# Patient Record
Sex: Female | Born: 1960 | Race: White | Hispanic: No | Marital: Married | Smoking: Never smoker
Health system: Southern US, Community
[De-identification: ages and names within clinical notes are randomized; demographics above are authoritative.]

## PROBLEM LIST (undated history)

## (undated) DIAGNOSIS — K219 Gastro-esophageal reflux disease without esophagitis: Secondary | ICD-10-CM

## (undated) DIAGNOSIS — A499 Bacterial infection, unspecified: Secondary | ICD-10-CM

## (undated) DIAGNOSIS — E079 Disorder of thyroid, unspecified: Secondary | ICD-10-CM

## (undated) DIAGNOSIS — E785 Hyperlipidemia, unspecified: Secondary | ICD-10-CM

## (undated) DIAGNOSIS — N390 Urinary tract infection, site not specified: Secondary | ICD-10-CM

## (undated) HISTORY — DX: Hyperlipidemia, unspecified: E78.5

## (undated) HISTORY — DX: Gastro-esophageal reflux disease without esophagitis: K21.9

## (undated) HISTORY — PX: REFRACTIVE SURGERY: SHX103

## (undated) HISTORY — PX: OTHER SURGICAL HISTORY: SHX169

## (undated) HISTORY — DX: Disorder of thyroid, unspecified: E07.9

## (undated) HISTORY — DX: Urinary tract infection, site not specified: N39.0

## (undated) HISTORY — PX: LASIK: SHX215

## (undated) HISTORY — DX: Urinary tract infection, site not specified: A49.9

---

## 1998-07-25 ENCOUNTER — Other Ambulatory Visit: Admission: RE | Admit: 1998-07-25 | Discharge: 1998-07-25 | Payer: Self-pay | Admitting: Obstetrics and Gynecology

## 2013-11-25 ENCOUNTER — Ambulatory Visit (INDEPENDENT_AMBULATORY_CARE_PROVIDER_SITE_OTHER): Payer: BC Managed Care – PPO | Admitting: Nurse Practitioner

## 2013-11-25 ENCOUNTER — Other Ambulatory Visit (HOSPITAL_COMMUNITY)
Admission: RE | Admit: 2013-11-25 | Discharge: 2013-11-25 | Disposition: A | Discharge status: Home / Self Care | Payer: BC Managed Care – PPO | Source: Ambulatory Visit | Attending: Nurse Practitioner | Admitting: Nurse Practitioner

## 2013-11-25 ENCOUNTER — Encounter: Payer: Self-pay | Admitting: Nurse Practitioner

## 2013-11-25 ENCOUNTER — Other Ambulatory Visit: Payer: Self-pay | Admitting: Nurse Practitioner

## 2013-11-25 VITALS — BP 127/78 | HR 79 | Temp 98.2°F | Ht 62.16 in | Wt 178.0 lb

## 2013-11-25 DIAGNOSIS — Z1151 Encounter for screening for human papillomavirus (HPV): Secondary | ICD-10-CM | POA: Insufficient documentation

## 2013-11-25 DIAGNOSIS — Z0000 Encounter for general adult medical examination without abnormal findings: Secondary | ICD-10-CM

## 2013-11-25 DIAGNOSIS — Z1239 Encounter for other screening for malignant neoplasm of breast: Secondary | ICD-10-CM

## 2013-11-25 DIAGNOSIS — Z01419 Encounter for gynecological examination (general) (routine) without abnormal findings: Secondary | ICD-10-CM | POA: Insufficient documentation

## 2013-11-25 DIAGNOSIS — Z124 Encounter for screening for malignant neoplasm of cervix: Secondary | ICD-10-CM | POA: Insufficient documentation

## 2013-11-25 LAB — COMPREHENSIVE METABOLIC PANEL
ALT: 20 U/L (ref 0–35)
AST: 16 U/L (ref 0–37)
Albumin: 4.4 g/dL (ref 3.5–5.2)
Alkaline Phosphatase: 53 U/L (ref 39–117)
BUN: 9 mg/dL (ref 6–23)
CALCIUM: 10 mg/dL (ref 8.4–10.5)
CHLORIDE: 101 meq/L (ref 96–112)
CO2: 29 meq/L (ref 19–32)
Creatinine, Ser: 0.8 mg/dL (ref 0.4–1.2)
GFR: 81.54 mL/min (ref >60.00)
Glucose, Bld: 87 mg/dL (ref 70–99)
Potassium: 3.7 mEq/L (ref 3.5–5.1)
SODIUM: 138 meq/L (ref 135–145)
Total Bilirubin: 0.7 mg/dL (ref 0.3–1.2)
Total Protein: 7.3 g/dL (ref 6.0–8.3)

## 2013-11-25 LAB — URINALYSIS, ROUTINE W REFLEX MICROSCOPIC
Bilirubin Urine: NEGATIVE
Hgb urine dipstick: NEGATIVE
Ketones, ur: 15 — AB
Leukocytes, UA: NEGATIVE
NITRITE: NEGATIVE
PH: 6 (ref 5.0–8.0)
RBC / HPF: NONE SEEN (ref 0–?)
SPECIFIC GRAVITY, URINE: 1.015 (ref 1.000–1.030)
Total Protein, Urine: NEGATIVE
Urine Glucose: NEGATIVE
Urobilinogen, UA: 0.2 (ref 0.0–1.0)

## 2013-11-25 LAB — LIPID PANEL
Cholesterol: 221 mg/dL — ABNORMAL HIGH (ref 0–200)
HDL: 61.3 mg/dL (ref >39.00)
LDL Cholesterol: 146 mg/dL — ABNORMAL HIGH (ref 0–99)
Total CHOL/HDL Ratio: 4
Triglycerides: 70 mg/dL (ref 0.0–149.0)
VLDL: 14 mg/dL (ref 0.0–40.0)

## 2013-11-25 LAB — CBC
HEMATOCRIT: 35.2 % — AB (ref 36.0–46.0)
Hemoglobin: 11.5 g/dL — ABNORMAL LOW (ref 12.0–15.0)
MCHC: 32.8 g/dL (ref 30.0–36.0)
MCV: 83.4 fl (ref 78.0–100.0)
Platelets: 265 10*3/uL (ref 150.0–400.0)
RBC: 4.22 Mil/uL (ref 3.87–5.11)
RDW: 14.7 % — ABNORMAL HIGH (ref 11.5–14.6)
WBC: 7.3 10*3/uL (ref 4.5–10.5)

## 2013-11-25 LAB — TSH: TSH: 4.61 u[IU]/mL (ref 0.35–5.50)

## 2013-11-25 LAB — HEMOGLOBIN A1C: HEMOGLOBIN A1C: 6.2 % (ref 4.6–6.5)

## 2013-11-25 NOTE — Progress Notes (Signed)
Pre visit review using our clinic review tool, if applicable. No additional management support is needed unless otherwise documented below in the visit note. 

## 2013-11-25 NOTE — Progress Notes (Signed)
Subjective:     Jill Berry is a 53 y.o. female and is here to establish care. Historical PC at [facility]. The patient reports no problems.  History   Social History  . Marital Status: Married    Spouse Name: N/A    Number of Children: 2  . Years of Education: HS   Occupational History  .      authorizations for biologic meds   Social History Main Topics  . Smoking status: Never Smoker   . Smokeless tobacco: Never Used  . Alcohol Use: Yes     Comment: occasional  . Drug Use: No  . Sexual Activity: Yes   Other Topics Concern  . Not on file   Social History Narrative   Jill Berry lives with her husband in [place]. She has 2 grown children. 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      Her son was treated for medullary thyroid cancer at [facility] with thyrectomy. He has mets (2 areas) to bones which have remained unchanged since 2010.    (symtpoms began in HS with facial flushing and abdominal cramping which progressed palpitations & purple discoloration of face, neck, chest)    He has yearly surveillance w/ MRI & CT scans. He is doing well, about to graduate from law school in [place], [place].   Health Maintenance  Topic Date Due  . Mammogram  11/13/2012  . Influenza Vaccine  04/30/2014 (Originally 04/08/2013)  . Tetanus/tdap  11/29/2014  . Pap Smear  11/25/2016  . Colonoscopy  11/29/2022    The following portions of the patient's history were reviewed and updated as appropriate: allergies, current medications, past family history, past medical history, past social history, past surgical history and problem list.  Review of Systems Constitutional: negative for chills, fatigue, fevers and night sweats Eyes: had lasik surgery Respiratory: negative for cough Cardiovascular: negative for chest pressure/discomfort, fatigue, irregular heart beat and lower extremity edema Gastrointestinal: negative for abdominal pain and change  in bowel habits Genitourinary:negative for abnormal menstrual periods Integument/breast: negative for breast tenderness and rash Musculoskeletal:negative for arthralgias and myalgias Neurological: negative for headaches Behavioral/Psych: negative for anxiety, depression, excessive alcohol consumption, illegal drug usage, sleep disturbance and tobacco use Endocrine: negative for diabetic symptoms including polydipsia, polyphagia and polyuria and temperature intolerance   Objective:    BP 127/78  Pulse 79  Temp(Src) 98.2 F (36.8 C) (Temporal)  Ht 5' 2.16" (1.579 m)  Wt 178 lb (80.74 kg)  BMI 32.38 kg/m2  SpO2 99%  LMP 10/09/2013 General appearance: alert, cooperative, appears stated age and no distress Head: Normocephalic, without obvious abnormality, atraumatic Eyes: negative findings: lids and lashes normal, conjunctivae and sclerae normal, corneas clear and pupils equal, round, reactive to light and accomodation Ears: normal TM's and external ear canals both ears Throat: lips, mucosa, and tongue normal; teeth and gums normal Neck: no adenopathy, no carotid bruit, supple, symmetrical, trachea midline and thyroid not enlarged, symmetric, no tenderness/mass/nodules Lungs: clear to auscultation bilaterally Breasts: normal appearance, no masses or tenderness Heart: regular rate and rhythm, S1, S2 normal, no murmur, click, rub or gallop Abdomen: soft, non-tender; bowel sounds normal; no masses,  no organomegaly Pelvic: adnexae not palpable, cervix normal in appearance, external genitalia normal, no adnexal masses or tenderness, no bladder tenderness, no cervical motion tenderness, perianal skin: no external genital warts noted, urethra without abnormality or discharge and vagina normal without discharge Extremities: extremities normal, atraumatic, no cyanosis or edema Pulses:  2+ and symmetric Skin: Skin color, texture, turgor normal. No rashes or lesions Lymph nodes: Cervical,  supraclavicular, and axillary nodes normal.    Assessment & Plan:    Healthy female exam.  See problem list for complete A&P.    F/u as indicated per lab results.

## 2013-11-25 NOTE — Patient Instructions (Signed)
Our office will call you with lab results. Your exam is normal. Please get mammogram. Please make goal to walk 30 minutes daily for many health benefits including lowering your risks for heart disease, diabetes, cancers, dementia, better bone health & sleep quality; management of depression & anxiety, weight loss.   Preventive Care for Adults, Female A healthy lifestyle and preventive care can promote health and wellness. Preventive health guidelines for women include the following key practices.  A routine yearly physical is a good way to check with your health care provider about your health and preventive screening. It is a chance to share any concerns and updates on your health and to receive a thorough exam.  Visit your dentist for a routine exam and preventive care every 6 months. Brush your teeth twice a day and floss once a day. Good oral hygiene prevents tooth decay and gum disease.  The frequency of eye exams is based on your age, health, family medical history, use of contact lenses, and other factors. Follow your health care provider's recommendations for frequency of eye exams.  Eat a healthy diet. Foods like vegetables, fruits, whole grains, low-fat dairy products, and lean protein foods contain the nutrients you need without too many calories. Decrease your intake of foods high in solid fats, added sugars, and salt. Eat the right amount of calories for you.Get information about a proper diet from your health care provider, if necessary.  Regular physical exercise is one of the most important things you can do for your health. Most adults should get at least 150 minutes of moderate-intensity exercise (any activity that increases your heart rate and causes you to sweat) each week. In addition, most adults need muscle-strengthening exercises on 2 or more days a week.  Maintain a healthy weight. The body mass index (BMI) is a screening tool to identify possible weight problems. It provides  an estimate of body fat based on height and weight. Your health care provider can find your BMI, and can help you achieve or maintain a healthy weight.For adults 20 years and older:  A BMI below 18.5 is considered underweight.  A BMI of 18.5 to 24.9 is normal.  A BMI of 25 to 29.9 is considered overweight.  A BMI of 30 and above is considered obese.  Maintain normal blood lipids and cholesterol levels by exercising and minimizing your intake of saturated fat. Eat a balanced diet with plenty of fruit and vegetables. Blood tests for lipids and cholesterol should begin at age 33 and be repeated every 5 years. If your lipid or cholesterol levels are high, you are over 50, or you are at high risk for heart disease, you may need your cholesterol levels checked more frequently.Ongoing high lipid and cholesterol levels should be treated with medicines if diet and exercise are not working.  If you smoke, find out from your health care provider how to quit. If you do not use tobacco, do not start.  Lung cancer screening is recommended for adults aged 68 80 years who are at high risk for developing lung cancer because of a history of smoking. A yearly low-dose CT scan of the lungs is recommended for people who have at least a 30-pack-year history of smoking and are a current smoker or have quit within the past 15 years. A pack year of smoking is smoking an average of 1 pack of cigarettes a day for 1 year (for example: 1 pack a day for 30 years or 2 packs  a day for 15 years). Yearly screening should continue until the smoker has stopped smoking for at least 15 years. Yearly screening should be stopped for people who develop a health problem that would prevent them from having lung cancer treatment.  If you are pregnant, do not drink alcohol. If you are breastfeeding, be very cautious about drinking alcohol. If you are not pregnant and choose to drink alcohol, do not have more than 1 drink per day. One drink is  considered to be 12 ounces (355 mL) of beer, 5 ounces (148 mL) of wine, or 1.5 ounces (44 mL) of liquor.  Avoid use of street drugs. Do not share needles with anyone. Ask for help if you need support or instructions about stopping the use of drugs.  High blood pressure causes heart disease and increases the risk of stroke. Your blood pressure should be checked at least every 1 to 2 years. Ongoing high blood pressure should be treated with medicines if weight loss and exercise do not work.  If you are 60 51 years old, ask your health care provider if you should take aspirin to prevent strokes.  Diabetes screening involves taking a blood sample to check your fasting blood sugar level. This should be done once every 3 years, after age 73, if you are within normal weight and without risk factors for diabetes. Testing should be considered at a younger age or be carried out more frequently if you are overweight and have at least 1 risk factor for diabetes.  Breast cancer screening is essential preventive care for women. You should practice "breast self-awareness." This means understanding the normal appearance and feel of your breasts and may include breast self-examination. Any changes detected, no matter how small, should be reported to a health care provider. Women in their 49s and 30s should have a clinical breast exam (CBE) by a health care provider as part of a regular health exam every 1 to 3 years. After age 73, women should have a CBE every year. Starting at age 72, women should consider having a mammogram (breast X-ray test) every year. Women who have a family history of breast cancer should talk to their health care provider about genetic screening. Women at a high risk of breast cancer should talk to their health care providers about having an MRI and a mammogram every year.  Breast cancer gene (BRCA)-related cancer risk assessment is recommended for women who have family members with BRCA-related  cancers. BRCA-related cancers include breast, ovarian, tubal, and peritoneal cancers. Having family members with these cancers may be associated with an increased risk for harmful changes (mutations) in the breast cancer genes BRCA1 and BRCA2. Results of the assessment will determine the need for genetic counseling and BRCA1 and BRCA2 testing.  The Pap test is a screening test for cervical cancer. A Pap test can show cell changes on the cervix that might become cervical cancer if left untreated. A Pap test is a procedure in which cells are obtained and examined from the lower end of the uterus (cervix).  Women should have a Pap test starting at age 56.  Between ages 67 and 6, Pap tests should be repeated every 2 years.  Beginning at age 14, you should have a Pap test every 3 years as long as the past 3 Pap tests have been normal.  Some women have medical problems that increase the chance of getting cervical cancer. Talk to your health care provider about these problems. It is  especially important to talk to your health care provider if a new problem develops soon after your last Pap test. In these cases, your health care provider may recommend more frequent screening and Pap tests.  The above recommendations are the same for women who have or have not gotten the vaccine for human papillomavirus (HPV).  If you had a hysterectomy for a problem that was not cancer or a condition that could lead to cancer, then you no longer need Pap tests. Even if you no longer need a Pap test, a regular exam is a good idea to make sure no other problems are starting.  If you are between ages 70 and 26 years, and you have had normal Pap tests going back 10 years, you no longer need Pap tests. Even if you no longer need a Pap test, a regular exam is a good idea to make sure no other problems are starting.  If you have had past treatment for cervical cancer or a condition that could lead to cancer, you need Pap tests  and screening for cancer for at least 20 years after your treatment.  If Pap tests have been discontinued, risk factors (such as a new sexual partner) need to be reassessed to determine if screening should be resumed.  The HPV test is an additional test that may be used for cervical cancer screening. The HPV test looks for the virus that can cause the cell changes on the cervix. The cells collected during the Pap test can be tested for HPV. The HPV test could be used to screen women aged 30 years and older, and should be used in women of any age who have unclear Pap test results. After the age of 45, women should have HPV testing at the same frequency as a Pap test.  Colorectal cancer can be detected and often prevented. Most routine colorectal cancer screening begins at the age of 60 years and continues through age 76 years. However, your health care provider may recommend screening at an earlier age if you have risk factors for colon cancer. On a yearly basis, your health care provider may provide home test kits to check for hidden blood in the stool. Use of a small camera at the end of a tube, to directly examine the colon (sigmoidoscopy or colonoscopy), can detect the earliest forms of colorectal cancer. Talk to your health care provider about this at age 79, when routine screening begins. Direct exam of the colon should be repeated every 5 10 years through age 71 years, unless early forms of pre-cancerous polyps or small growths are found.  People who are at an increased risk for hepatitis B should be screened for this virus. You are considered at high risk for hepatitis B if:  You were born in a country where hepatitis B occurs often. Talk with your health care provider about which countries are considered high risk.  Your parents were born in a high-risk country and you have not received a shot to protect against hepatitis B (hepatitis B vaccine).  You have HIV or AIDS.  You use needles to  inject street drugs.  You live with, or have sex with, someone who has Hepatitis B.  You get hemodialysis treatment.  You take certain medicines for conditions like cancer, organ transplantation, and autoimmune conditions.  Hepatitis C blood testing is recommended for all people born from [birth year] through 1965 and any individual with known risks for hepatitis C.  Practice safe sex. Use  condoms and avoid high-risk sexual practices to reduce the spread of sexually transmitted infections (STIs). STIs include gonorrhea, chlamydia, syphilis, trichomonas, herpes, HPV, and human immunodeficiency virus (HIV). Herpes, HIV, and HPV are viral illnesses that have no cure. They can result in disability, cancer, and death. Sexually active women aged 18 years and younger should be checked for chlamydia. Older women with new or multiple partners should also be tested for chlamydia. Testing for other STIs is recommended if you are sexually active and at increased risk.  Osteoporosis is a disease in which the bones lose minerals and strength with aging. This can result in serious bone fractures or breaks. The risk of osteoporosis can be identified using a bone density scan. Women ages 25 years and over and women at risk for fractures or osteoporosis should discuss screening with their health care providers. Ask your health care provider whether you should take a calcium supplement or vitamin D to reduce the rate of osteoporosis.  Menopause can be associated with physical symptoms and risks. Hormone replacement therapy is available to decrease symptoms and risks. You should talk to your health care provider about whether hormone replacement therapy is right for you.  Use sunscreen. Apply sunscreen liberally and repeatedly throughout the day. You should seek shade when your shadow is shorter than you. Protect yourself by wearing long sleeves, pants, a wide-brimmed hat, and sunglasses year round, whenever you are  outdoors.  Once a month, do a whole body skin exam, using a mirror to look at the skin on your back. Tell your health care provider of new moles, moles that have irregular borders, moles that are larger than a pencil eraser, or moles that have changed in shape or color.  Stay current with required vaccines (immunizations).  Influenza vaccine. All adults should be immunized every year.  Tetanus, diphtheria, and acellular pertussis (Td, Tdap) vaccine. Pregnant women should receive 1 dose of Tdap vaccine during each pregnancy. The dose should be obtained regardless of the length of time since the last dose. Immunization is preferred during the 27th 36th week of gestation. An adult who has not previously received Tdap or who does not know her vaccine status should receive 1 dose of Tdap. This initial dose should be followed by tetanus and diphtheria toxoids (Td) booster doses every 10 years. Adults with an unknown or incomplete history of completing a 3-dose immunization series with Td-containing vaccines should begin or complete a primary immunization series including a Tdap dose. Adults should receive a Td booster every 10 years.  Varicella vaccine. An adult without evidence of immunity to varicella should receive 2 doses or a second dose if she has previously received 1 dose. Pregnant females who do not have evidence of immunity should receive the first dose after pregnancy. This first dose should be obtained before leaving the health care facility. The second dose should be obtained 4 8 weeks after the first dose.  Human papillomavirus (HPV) vaccine. Females aged 76 26 years who have not received the vaccine previously should obtain the 3-dose series. The vaccine is not recommended for use in pregnant females. However, pregnancy testing is not needed before receiving a dose. If a female is found to be pregnant after receiving a dose, no treatment is needed. In that case, the remaining doses should be  delayed until after the pregnancy. Immunization is recommended for any person with an immunocompromised condition through the age of 32 years if she did not get any or all doses earlier.  During the 3-dose series, the second dose should be obtained 4 8 weeks after the first dose. The third dose should be obtained 24 weeks after the first dose and 16 weeks after the second dose.  Zoster vaccine. One dose is recommended for adults aged 73 years or older unless certain conditions are present.  Measles, mumps, and rubella (MMR) vaccine. Adults born before [birth year] generally are considered immune to measles and mumps. Adults born in [birth year] or later should have 1 or more doses of MMR vaccine unless there is a contraindication to the vaccine or there is laboratory evidence of immunity to each of the three diseases. A routine second dose of MMR vaccine should be obtained at least 28 days after the first dose for students attending postsecondary schools, health care workers, or international travelers. People who received inactivated measles vaccine or an unknown type of measles vaccine during 1963 1967 should receive 2 doses of MMR vaccine. People who received inactivated mumps vaccine or an unknown type of mumps vaccine before 1979 and are at high risk for mumps infection should consider immunization with 2 doses of MMR vaccine. For females of childbearing age, rubella immunity should be determined. If there is no evidence of immunity, females who are not pregnant should be vaccinated. If there is no evidence of immunity, females who are pregnant should delay immunization until after pregnancy. Unvaccinated health care workers born before [birth year] who lack laboratory evidence of measles, mumps, or rubella immunity or laboratory confirmation of disease should consider measles and mumps immunization with 2 doses of MMR vaccine or rubella immunization with 1 dose of MMR vaccine.  Pneumococcal 13-valent conjugate (PCV13) vaccine.  When indicated, a person who is uncertain of her immunization history and has no record of immunization should receive the PCV13 vaccine. An adult aged 63 years or older who has certain medical conditions and has not been previously immunized should receive 1 dose of PCV13 vaccine. This PCV13 should be followed with a dose of pneumococcal polysaccharide (PPSV23) vaccine. The PPSV23 vaccine dose should be obtained at least 8 weeks after the dose of PCV13 vaccine. An adult aged 47 years or older who has certain medical conditions and previously received 1 or more doses of PPSV23 vaccine should receive 1 dose of PCV13. The PCV13 vaccine dose should be obtained 1 or more years after the last PPSV23 vaccine dose.  Pneumococcal polysaccharide (PPSV23) vaccine. When PCV13 is also indicated, PCV13 should be obtained first. All adults aged 64 years and older should be immunized. An adult younger than age 13 years who has certain medical conditions should be immunized. Any person who resides in a nursing home or long-term care facility should be immunized. An adult smoker should be immunized. People with an immunocompromised condition and certain other conditions should receive both PCV13 and PPSV23 vaccines. People with human immunodeficiency virus (HIV) infection should be immunized as soon as possible after diagnosis. Immunization during chemotherapy or radiation therapy should be avoided. Routine use of PPSV23 vaccine is not recommended for American Indians, [place] Natives, or people younger than 65 years unless there are medical conditions that require PPSV23 vaccine. When indicated, people who have unknown immunization and have no record of immunization should receive PPSV23 vaccine. One-time revaccination 5 years after the first dose of PPSV23 is recommended for people aged 26 64 years who have chronic kidney failure, nephrotic syndrome, asplenia, or immunocompromised conditions. People who received 1 2 doses of  PPSV23 before age 45 years should receive another  dose of PPSV23 vaccine at age 31 years or later if at least 5 years have passed since the previous dose. Doses of PPSV23 are not needed for people immunized with PPSV23 at or after age 32 years.  Meningococcal vaccine. Adults with asplenia or persistent complement component deficiencies should receive 2 doses of quadrivalent meningococcal conjugate (MenACWY-D) vaccine. The doses should be obtained at least 2 months apart. Microbiologists working with certain meningococcal bacteria, [place] recruits, people at risk during an outbreak, and people who travel to or live in countries with a high rate of meningitis should be immunized. A first-year college student up through age 52 years who is living in a residence hall should receive a dose if she did not receive a dose on or after her 16th birthday. Adults who have certain high-risk conditions should receive one or more doses of vaccine.  Hepatitis A vaccine. Adults who wish to be protected from this disease, have certain high-risk conditions, work with hepatitis A-infected animals, work in hepatitis A research labs, or travel to or work in countries with a high rate of hepatitis A should be immunized. Adults who were previously unvaccinated and who anticipate close contact with an international adoptee during the first 60 days after arrival in the [place] from a country with a high rate of hepatitis A should be immunized.  Hepatitis B vaccine. Adults who wish to be protected from this disease, have certain high-risk conditions, may be exposed to blood or other infectious body fluids, are household contacts or sex partners of hepatitis B positive people, are clients or workers in certain care facilities, or travel to or work in countries with a high rate of hepatitis B should be immunized.  Haemophilus influenzae type b (Hib) vaccine. A previously unvaccinated person with asplenia or sickle cell disease  or having a scheduled splenectomy should receive 1 dose of Hib vaccine. Regardless of previous immunization, a recipient of a hematopoietic stem cell transplant should receive a 3-dose series 6 12 months after her successful transplant. Hib vaccine is not recommended for adults with HIV infection. Preventive Services / Frequency Ages 17 to 64years  Blood pressure check.** / Every 1 to 2 years.  Lipid and cholesterol check.** / Every 5 years beginning at age 66 years.  Lung cancer screening. / Every year if you are aged 57 80 years and have a 30-pack-year history of smoking and currently smoke or have quit within the past 15 years. Yearly screening is stopped once you have quit smoking for at least 15 years or develop a health problem that would prevent you from having lung cancer treatment.  Clinical breast exam.** / Every year after age 68 years.  BRCA-related cancer risk assessment.** / For women who have family members with a BRCA-related cancer (breast, ovarian, tubal, or peritoneal cancers).  Mammogram.** / Every year beginning at age 54 years and continuing for as long as you are in good health. Consult with your health care provider.  Pap test.** / Every 3 years starting at age 10 years through age 30 or 82 years with a history of 3 consecutive normal Pap tests.  HPV screening.** / Every 3 years from ages 67 years through ages 55 to 75 years with a history of 3 consecutive normal Pap tests.  Fecal occult blood test (FOBT) of stool. / Every year beginning at age 25 years and continuing until age 81 years. You may not need to do this test if you get a colonoscopy every 10  years.  Flexible sigmoidoscopy or colonoscopy.** / Every 5 years for a flexible sigmoidoscopy or every 10 years for a colonoscopy beginning at age 51 years and continuing until age 39 years.  Hepatitis C blood test.** / For all people born from [birth year] through 1963 and any individual with known risks for hepatitis  C.  Skin self-exam. / Monthly.  Influenza vaccine. / Every year.  Tetanus, diphtheria, and acellular pertussis (Tdap/Td) vaccine.** / Consult your health care provider. Pregnant women should receive 1 dose of Tdap vaccine during each pregnancy. 1 dose of Td every 10 years.  Varicella vaccine.** / Consult your health care provider. Pregnant females who do not have evidence of immunity should receive the first dose after pregnancy.  Zoster vaccine.** / 1 dose for adults aged 70 years or older.  Measles, mumps, rubella (MMR) vaccine.** / You need at least 1 dose of MMR if you were born in 1957 or later. You may also need a 2nd dose. For females of childbearing age, rubella immunity should be determined. If there is no evidence of immunity, females who are not pregnant should be vaccinated. If there is no evidence of immunity, females who are pregnant should delay immunization until after pregnancy.  Pneumococcal 13-valent conjugate (PCV13) vaccine.** / Consult your health care provider.  Pneumococcal polysaccharide (PPSV23) vaccine.** / 1 to 2 doses if you smoke cigarettes or if you have certain conditions.  Meningococcal vaccine.** / Consult your health care provider.  Hepatitis A vaccine.** / Consult your health care provider.  Hepatitis B vaccine.** / Consult your health care provider.  Haemophilus influenzae type b (Hib) vaccine.** / Consult your health care provider. ** Family history and personal history of risk and conditions may change your health care provider's recommendations. Document Released: 10/21/2001 Document Revised: 06/15/2013 Document Reviewed: 01/20/2011 [facility] Patient Information 2014 [place], [place].

## 2013-11-26 LAB — HIV ANTIBODY (ROUTINE TESTING W REFLEX): HIV: NONREACTIVE

## 2013-11-26 LAB — VITAMIN D 25 HYDROXY (VIT D DEFICIENCY, FRACTURES): VIT D 25 HYDROXY: 39 ng/mL (ref 30–89)

## 2013-11-28 ENCOUNTER — Telehealth: Payer: Self-pay | Admitting: Nurse Practitioner

## 2013-11-28 ENCOUNTER — Encounter: Payer: Self-pay | Admitting: Nurse Practitioner

## 2013-11-28 LAB — T4, FREE: FREE T4: 0.79 ng/dL — AB (ref 0.80–1.80)

## 2013-11-28 NOTE — Assessment & Plan Note (Signed)
Repeat MMG at [facility] Orders placed.

## 2013-11-28 NOTE — Telephone Encounter (Signed)
Patient notified of results and has scheduled ov appt for 12/02/2013 at 3:30 pm.

## 2013-11-28 NOTE — Telephone Encounter (Signed)
Slightly anemic Hgb 11.5 Pre-diabetes A1C 6.2 Hypothyroid: TSH 4.61, Free T4 0.79 Will discuss at f/u. recmd starting 25 mcg synthroid.  Lipids: LDL elevated. 10 yr ASCVD risk is low 1.34% Vit D low. Goal 50-80. Recmd 1000 IU D3 qd Will sched f/u visit to discuss labs.

## 2013-11-28 NOTE — Assessment & Plan Note (Signed)
Next CPE in 1 yr. Screening labs: TSH, CBC, CMET, Vit D, lipids, HgbA1C

## 2013-12-02 ENCOUNTER — Ambulatory Visit: Payer: BC Managed Care – PPO | Admitting: Nurse Practitioner

## 2013-12-02 ENCOUNTER — Encounter: Payer: Self-pay | Admitting: Nurse Practitioner

## 2013-12-02 ENCOUNTER — Ambulatory Visit (INDEPENDENT_AMBULATORY_CARE_PROVIDER_SITE_OTHER): Payer: BC Managed Care – PPO | Admitting: Nurse Practitioner

## 2013-12-02 VITALS — Ht 62.16 in

## 2013-12-02 DIAGNOSIS — R7309 Other abnormal glucose: Secondary | ICD-10-CM

## 2013-12-02 DIAGNOSIS — R946 Abnormal results of thyroid function studies: Secondary | ICD-10-CM

## 2013-12-02 DIAGNOSIS — R7303 Prediabetes: Secondary | ICD-10-CM

## 2013-12-02 DIAGNOSIS — E559 Vitamin D deficiency, unspecified: Secondary | ICD-10-CM

## 2013-12-02 DIAGNOSIS — H6120 Impacted cerumen, unspecified ear: Secondary | ICD-10-CM

## 2013-12-02 DIAGNOSIS — R7989 Other specified abnormal findings of blood chemistry: Secondary | ICD-10-CM | POA: Insufficient documentation

## 2013-12-02 LAB — EAR CERUMEN REMOVAL

## 2013-12-02 NOTE — Progress Notes (Signed)
Pre visit review using our clinic review tool, if applicable. No additional management support is needed unless otherwise documented below in the visit note. 

## 2013-12-02 NOTE — Patient Instructions (Signed)
For diabetes prevention, exercise goal is 30 minutes daily after largest meal of day or 2, 15 minute walks daily. Limit calories from sugar to 100 daily or cut out processed sugar. Limit white flour, choose whole grains instead. Eat 5 servings fruits & veggies daily-at least 1 should be fresh.  See nutrition center for diabetes prevention education. Take 1000 iu D3 daily. Please see Dr Chalmers Cater regarding follow up of thyroid labs. Pap nml & HPV screen neg, so next pelvic exam in 5 years unless you have concerns. To keep ears clear, flush with warm water 2-3 times weekly when getting into shower. See you in 6 mos.  Exercise to Stay Healthy Exercise helps you become and stay healthy. EXERCISE IDEAS AND TIPS Choose exercises that:  You enjoy.  Fit into your day. You do not need to exercise really hard to be healthy. You can do exercises at a slow or medium level and stay healthy. You can:  Stretch before and after working out.  Try yoga, Pilates, or tai chi.  Lift weights.  Walk fast, swim, jog, run, climb stairs, bicycle, dance, or rollerskate.  Take aerobic classes. Exercises that burn about 150 calories:  Running 1  miles in 15 minutes.  Playing volleyball for 45 to 60 minutes.  Washing and waxing a car for 45 to 60 minutes.  Playing touch football for 45 minutes.  Walking 1  miles in 35 minutes.  Pushing a stroller 1  miles in 30 minutes.  Playing basketball for 30 minutes.  Raking leaves for 30 minutes.  Bicycling 5 miles in 30 minutes.  Walking 2 miles in 30 minutes.  Dancing for 30 minutes.  Shoveling snow for 15 minutes.  Swimming laps for 20 minutes.  Walking up stairs for 15 minutes.  Bicycling 4 miles in 15 minutes.  Gardening for 30 to 45 minutes.  Jumping rope for 15 minutes.  Washing windows or floors for 45 to 60 minutes. Document Released: 09/27/2010 Document Revised: 11/17/2011 Document Reviewed: 09/27/2010 [facility] Patient Information  2014 [place], [place].  Cerumen Impaction A cerumen impaction is when the wax in your ear forms a plug. This plug usually causes reduced hearing. Sometimes it also causes an earache or dizziness. Removing a cerumen impaction can be difficult and painful. The wax sticks to the ear canal. The canal is sensitive and bleeds easily. If you try to remove a heavy wax buildup with a cotton tipped swab, you may push it in further. Irrigation with water, suction, and small ear curettes may be used to clear out the wax. If the impaction is fixed to the skin in the ear canal, ear drops may be needed for a few days to loosen the wax. People who build up a lot of wax frequently can use ear wax removal products available in your local drugstore. SEEK MEDICAL CARE IF:  You develop an earache, increased hearing loss, or marked dizziness. Document Released: 10/02/2004 Document Revised: 11/17/2011 Document Reviewed: 11/22/2009 [facility] Patient Information 2014 [place], [place].

## 2013-12-05 NOTE — Assessment & Plan Note (Signed)
TSH 4.61, Free T4 .79 Offered to start synthroid or recheck labs in few weeks. Pt request ref to endo due to son's Hx of medullary thyroid CA.  Ref placed to Dr Chalmers Cater, per pt request.

## 2013-12-05 NOTE — Progress Notes (Signed)
Subjective:     Jill Berry is a 53 y.o. female. She returns to discuss labs after CPE. We discussed multiple issues: A1C, low vit D, Low freeT4.  Re A1C: she is in pre-diabetes range-discussed increased risk for developing DM. Preventive  interventions discussed. Pt desires MNT ref. Re Vit D def: discussed relationship to Vit D & calcium absorption & thyroid function. Made recommendations for replacmt therapy. Re: Low FreeT4, discussed role of thyroid & relationship to metabolic processes. Pt wishes to see endo as her son has medullary thyroid CA. We also discussed PAp results & f/u-nml next screen in 5 yrs as HPV Neg.; lipid results: 10 yr risk of developing ASCVD is <3%, so no statin. Answered all questions. Spent 30 minutes discussing results, indications, & recommendations.    The following portions of the patient's history were reviewed and updated as appropriate: allergies, current medications, past family history, past medical history, past social history, past surgical history and problem list.  Review of Systems Pertinent items are noted in HPI.    Objective:    Ht 5' 2.16" (1.579 m)  LMP 10/09/2013 Ht 5' 2.16" (1.579 m)  LMP 10/09/2013 General appearance: alert, cooperative, appears stated age and no distress Head: Normocephalic, without obvious abnormality, atraumatic Eyes: negative findings: lids and lashes normal and conjunctivae and sclerae normal Ears: unable to visualize TM due to ceruminosis.    Assessment & Plan:     1. Low T4   2. Ceruminosis   3. Unspecified vitamin D deficiency   4. Pre-diabetes   See prob list for complete A&P Ref to endo F/u 6 mos.

## 2013-12-05 NOTE — Assessment & Plan Note (Signed)
Goal 50-80. Recmd start D3 1000 iu qd. Recheck levels in 3 mos.

## 2013-12-05 NOTE — Assessment & Plan Note (Signed)
Discussed diet & exercise changes: 30 min walk daily, after largest meal of day if possible. Limit refined sugar to <100 cals qd. 5 srvg fruits & veggies qd. [facility] Offered [facility] at [facility]. Pt agrees. Discussed risk of developing diabetes.

## 2013-12-05 NOTE — Assessment & Plan Note (Signed)
bilat ceruminosis. Water flush in ofc successful. Recmd warm water flush 2-3 times week when getting in shower.

## 2014-01-09 ENCOUNTER — Encounter: Payer: Self-pay | Admitting: *Deleted

## 2014-01-09 ENCOUNTER — Encounter: Payer: BC Managed Care – PPO | Attending: Nurse Practitioner | Admitting: *Deleted

## 2014-01-09 DIAGNOSIS — E669 Obesity, unspecified: Secondary | ICD-10-CM

## 2014-01-09 DIAGNOSIS — R7303 Prediabetes: Secondary | ICD-10-CM

## 2014-01-09 DIAGNOSIS — R7309 Other abnormal glucose: Secondary | ICD-10-CM | POA: Insufficient documentation

## 2014-01-09 DIAGNOSIS — E039 Hypothyroidism, unspecified: Secondary | ICD-10-CM | POA: Insufficient documentation

## 2014-01-09 DIAGNOSIS — Z833 Family history of diabetes mellitus: Secondary | ICD-10-CM | POA: Insufficient documentation

## 2014-01-09 DIAGNOSIS — Z713 Dietary counseling and surveillance: Secondary | ICD-10-CM | POA: Insufficient documentation

## 2014-01-09 NOTE — Progress Notes (Signed)
  Medical Nutrition Therapy:  Appt start time: 0930 end time:  1030.   Assessment:  Primary concerns today: Maritta is here for nutrition counseling pertaining to prediabetes and hypothyroidism. She recently found out she has an extensive family history of hyperglycemia.  Her most recent HgA1c is 6.2%.  Yenni was just started on levothyroixine, but she's not sure of the dose.   She is interested in learning how to eat more healthfully.  She tries to limit NNS and uses honey or guava nectar instead of white sugar.  They are trying to eat more salads; she eats oatmeal with fruit each morning.  Judiann lives with her husband.  They share grocery shopping responsibilities and cooking.  They don't usually fry foods and either bake or grill.  She states they eat out a lot: [place], [employer], wherever they get a Groupon.  Preferred Learning Style:   Auditory   Learning Readiness:   Change in progress   MEDICATIONS: levothyroxine   DIETARY INTAKE:  Usual eating pattern includes 3 meals and 0 snacks per day.  Everyday foods include salad and chicken.  Avoided foods include: red meat (use ground [place] instead), no pork either, no sodas  24-hr recall:  B ( AM): instant oatmeal with fruit if available, and walnuts.  Drinks coffee with 2% milk with small glass of juice Snk ( AM): not usually  L ( PM): drug reps bring lunch every day: usually salad involved and chicken of some sort.  Sometimes they bring [place], but she tries to stay away from the rice and make a taco salad.   Snk ( PM): not usually.  Might have granola bar D ( PM): 2-3 nights/ week: fish or chicken with salad and steamed veggies.  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  Started in March.  Used to do  [employer] and likes that   Estimated energy needs: 1600 calories 180 g carbohydrates 120 g protein 44 g fat    Nutritional Diagnosis:  [place]-2.1 Inpaired nutrition utilization As related to carbohydrates.  As evidenced by HgA1c of 6.2%.    Intervention:  Nutrition counseling provided.  Discussed carbohydrates metabolism and physiology of diabetes.  Discussed role of genetics on hyperglycemia.  Discussed MyPlate, portion control, increasing vegetables, physical activity, and reading food lables  Teaching Method Utilized:  Visual Auditory   Handouts given during visit include: MyPlate for Diabetes Food Label handouts  Meal Plan Card   Barriers to learning/adherence to lifestyle change: eating out  Demonstrated degree of understanding via:  Teach Back   Monitoring/Evaluation:  Dietary intake, exercise, HgA1c, and body weight prn.

## 2014-01-09 NOTE — Patient Instructions (Signed)
Think of fun exercise Talk with doctor about sleep study

## 2014-01-10 ENCOUNTER — Encounter: Payer: Self-pay | Admitting: Nurse Practitioner

## 2014-01-10 DIAGNOSIS — E039 Hypothyroidism, unspecified: Secondary | ICD-10-CM | POA: Insufficient documentation

## 2014-02-02 ENCOUNTER — Encounter: Payer: Self-pay | Admitting: Nurse Practitioner

## 2014-06-02 ENCOUNTER — Ambulatory Visit: Payer: BC Managed Care – PPO | Admitting: Nurse Practitioner

## 2015-06-08 ENCOUNTER — Ambulatory Visit (INDEPENDENT_AMBULATORY_CARE_PROVIDER_SITE_OTHER): Payer: BLUE CROSS/BLUE SHIELD | Admitting: Family Medicine

## 2015-06-08 ENCOUNTER — Encounter: Payer: Self-pay | Admitting: Family Medicine

## 2015-06-08 VITALS — BP 122/79 | HR 86 | Temp 98.0°F | Resp 18 | Ht 63.0 in | Wt 188.0 lb

## 2015-06-08 DIAGNOSIS — Z1239 Encounter for other screening for malignant neoplasm of breast: Secondary | ICD-10-CM

## 2015-06-08 DIAGNOSIS — Z0000 Encounter for general adult medical examination without abnormal findings: Secondary | ICD-10-CM

## 2015-06-08 DIAGNOSIS — E039 Hypothyroidism, unspecified: Secondary | ICD-10-CM | POA: Diagnosis not present

## 2015-06-08 DIAGNOSIS — R7303 Prediabetes: Secondary | ICD-10-CM

## 2015-06-08 DIAGNOSIS — E785 Hyperlipidemia, unspecified: Secondary | ICD-10-CM | POA: Diagnosis not present

## 2015-06-08 DIAGNOSIS — E559 Vitamin D deficiency, unspecified: Secondary | ICD-10-CM

## 2015-06-08 DIAGNOSIS — R7309 Other abnormal glucose: Secondary | ICD-10-CM

## 2015-06-08 LAB — CBC WITH DIFFERENTIAL/PLATELET
BASOS ABS: 0 10*3/uL (ref 0.0–0.1)
Basophils Relative: 0.5 % (ref 0.0–3.0)
EOS ABS: 0.2 10*3/uL (ref 0.0–0.7)
Eosinophils Relative: 2.3 % (ref 0.0–5.0)
HCT: 37.8 % (ref 36.0–46.0)
Hemoglobin: 12.3 g/dL (ref 12.0–15.0)
LYMPHS ABS: 1.9 10*3/uL (ref 0.7–4.0)
Lymphocytes Relative: 27.3 % (ref 12.0–46.0)
MCHC: 32.5 g/dL (ref 30.0–36.0)
MCV: 84.4 fl (ref 78.0–100.0)
MONO ABS: 0.5 10*3/uL (ref 0.1–1.0)
Monocytes Relative: 6.5 % (ref 3.0–12.0)
NEUTROS PCT: 63.4 % (ref 43.0–77.0)
Neutro Abs: 4.4 10*3/uL (ref 1.4–7.7)
Platelets: 246 10*3/uL (ref 150.0–400.0)
RBC: 4.48 Mil/uL (ref 3.87–5.11)
RDW: 14.5 % (ref 11.5–15.5)
WBC: 7 10*3/uL (ref 4.0–10.5)

## 2015-06-08 LAB — COMPREHENSIVE METABOLIC PANEL
ALK PHOS: 64 U/L (ref 39–117)
ALT: 28 U/L (ref 0–35)
AST: 18 U/L (ref 0–37)
Albumin: 4.4 g/dL (ref 3.5–5.2)
BUN: 9 mg/dL (ref 6–23)
CO2: 24 mEq/L (ref 19–32)
Calcium: 9.9 mg/dL (ref 8.4–10.5)
Chloride: 102 mEq/L (ref 96–112)
Creatinine, Ser: 0.71 mg/dL (ref 0.40–1.20)
GFR: 91.68 mL/min (ref >60.00)
GLUCOSE: 72 mg/dL (ref 70–99)
POTASSIUM: 3.9 meq/L (ref 3.5–5.1)
SODIUM: 138 meq/L (ref 135–145)
TOTAL PROTEIN: 7.2 g/dL (ref 6.0–8.3)
Total Bilirubin: 0.5 mg/dL (ref 0.2–1.2)

## 2015-06-08 LAB — POCT GLYCOSYLATED HEMOGLOBIN (HGB A1C): Hemoglobin A1C: 5.6

## 2015-06-08 LAB — TSH: TSH: 2 u[IU]/mL (ref 0.35–4.50)

## 2015-06-08 LAB — VITAMIN D 25 HYDROXY (VIT D DEFICIENCY, FRACTURES): VITD: 32.97 ng/mL (ref 30.00–100.00)

## 2015-06-08 NOTE — Patient Instructions (Signed)

## 2015-06-08 NOTE — Progress Notes (Signed)
Pre visit review using our clinic review tool, if applicable. No additional management support is needed unless otherwise documented below in the visit note. 

## 2015-06-08 NOTE — Progress Notes (Signed)
Subjective:    Patient ID: Jill Berry, female    DOB: 12-28-1960, 54 y.o.   MRN: [id number]  HPI  Patient presents for complete physical/yearly exam.  Vitamin D deficiency: Patient has a history of vitamin D deficiency. She supplements with 1000 units of vitamin D a day.  Hypothyroidism: Patient currently sees endocrinology for her thyroid disorder. She would like to have her thyroid function follow-through PCP instead. Unknown last TSH. Patient states that she is followed every 6 months for her thyroid/TSH.  Hyperlipidemia: Patient has a history of hyperlipidemia, last lipid panel March 2015 with LDL 146, total cholesterol 221, triglycerides 70, HDL 61. Patient currently takes Lipitor 10 mg daily. She does not have a daily exercise routine, she does attempt to watch her diet closely.  Health maintenance:  Colonoscopy: Colonoscopy 2014 normal, Mammogram: Last mammogram 2015, normal Cervical cancer screening: Last Pap 2015, normal. Pap indicated every 3 years. Immunizations: Up-to-date tetanus, flu vaccination will be given through work. Infectious disease screening: HIV screening complete, hepatitis C screening indicated.  Past Medical History  Diagnosis Date  . Hyperlipidemia   . GERD (gastroesophageal reflux disease)   . Urinary tract bacterial infections   . Thyroid disease    No Known Allergies Past Surgical History  Procedure Laterality Date  . Cesarean section    . Cesarean section    . Refractive surgery    . Lasik Bilateral    Family History  Problem Relation Age of Onset  . Alcohol abuse Mother   . Cancer Mother     bladder, smoker  . Hyperlipidemia Mother   . Hypertension Mother   . Alcohol abuse Father   . Hyperlipidemia Father   . Hypertension Father   . Cancer Son     medullary thyroid   . Cancer Maternal Grandmother     stomach  . Parkinson's disease Maternal Grandfather   . Parkinson's disease Paternal Grandfather    Social History   Social  History  . Marital Status: Married    Spouse Name: N/A  . Number of Children: 2  . Years of Education: HS   Occupational History  .      authorizations for biologic meds   Social History Main Topics  . Smoking status: Never Smoker   . Smokeless tobacco: Never Used  . Alcohol Use: Yes     Comment: occasional  . Drug Use: No  . Sexual Activity: Yes   Other Topics Concern  . Not on file   Social History Narrative   [name] lives with her husband in [place]. She has 2 grown children. 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      Her son was treated for medullary thyroid cancer at [facility] with thyrectomy. He has mets (2 areas) to bones which have remained unchanged since 2010.    (symtpoms began in HS with facial flushing and abdominal cramping which progressed palpitations & purple discoloration of face, neck, chest)    He has yearly surveillance w/ MRI & CT scans. He is doing well, about to graduate from law school in [place], [place].    Review of Systems Negative, with the exception of above mentioned in HPI     Objective:   Physical Exam BP 122/79 mmHg  Pulse 86  Temp(Src) 98 F (36.7 C) (Oral)  Resp 18  Ht 5\' 3"  (1.6 m)  Wt 188 lb (85.276 kg)  BMI 33.31 kg/m2  SpO2 98%  LMP 05/25/2015  Gen: Afebrile. No acute distress. Nontoxic in appearance, well-developed, well-nourished, female. HENT: AT. [place]. Bilateral TM visualized and normal in appearance. MMM. Bilateral nares without erythema or swelling. Throat without erythema or exudates.  Eyes:Pupils Equal Round Reactive to light, Extraocular movements intact,  Conjunctiva without redness, discharge or icterus. Neck/lymp/endocrine: Supple, no lymphadenopathy, mild thyromegaly CV: RRR no murmurs appreciated, no edema, +2/4 P posterior tibialis pulses Chest: CTAB, no wheeze or crackles Abd: Soft. Round. NTND. BS present. No Masses palpated.  Skin: No rashes, purpura or petechiae.    Neuro: Normal gait. PERLA. EOMi. Alert. Oriented x3 Cranial nerves II through XII intact. Muscle strength 5/5 upper and lower extremity.  Psych: Normal affect, dress and demeanor. Normal speech. Normal thought content and judgment..      Assessment & Plan:  1. Vitamin D deficiency - Recheck vitamin D level today, will adjust medications as needed. Patient currently taking 1000 g daily. - Vitamin D (25 hydroxy)  2. Hypothyroidism, unspecified hypothyroidism type - Patient currently seeing endocrinology for her hypothyroidism.  - She would like to follow with her thyroid with PCP instead of endocrine. We'll collect TSH today, and refill medications as needed. - TSH - CBC with Differential - Comp Met (CMET)  3. Pre-diabetes - Last A1c March 2015 was 6.2, patient encouraged to exercise at least 150 minutes a week, and decrease the sugar/carb content in her diet. Will recheck A1c today, and prescribe medications if needed. - POCT HgB A1C  4. Breast cancer screening - Mammogram ordered today.  5. Health maintenance/preventive:  Colonoscopy: Colonoscopy 2014 normal, 10 year follow up Mammogram: Last mammogram 2015, normal. Ordered today Cervical cancer screening: Last Pap 2015, normal. Pap indicated every 3 years. Immunizations: Up-to-date tetanus, flu vaccination will be given through work. - Pt to check to see if seh received the Td or Tdap at [facility]. She wants to make certain she had the pertussis coverage. Infectious disease screening: HIV screening complete, hepatitis C screening indicated.   Follow up 6 months for hypothyroid/vitamin D

## 2015-06-11 ENCOUNTER — Encounter: Payer: Self-pay | Admitting: Family Medicine

## 2015-06-11 ENCOUNTER — Telehealth: Payer: Self-pay | Admitting: Family Medicine

## 2015-06-11 DIAGNOSIS — E785 Hyperlipidemia, unspecified: Secondary | ICD-10-CM | POA: Insufficient documentation

## 2015-06-11 MED ORDER — LEVOTHYROXINE SODIUM 50 MCG PO TABS: 50.0000 ug | ORAL_TABLET | Freq: Every day | ORAL | Status: AC

## 2015-06-11 NOTE — Telephone Encounter (Signed)
Patient aware of results.  Pt had no questions at this time.

## 2015-06-11 NOTE — Telephone Encounter (Signed)
Please call pt:  - All of her blood glucose normal. - Her A1c improved. Her last A1c was 6.2 this A1c is 5.6.  - Her TSH/thyroid was normal. I have called in refills of her Synthroid. - Her vitamin D was still low normal at 32. I would recommend she double her vitamin D supplementation that she is taking currently (1000 units daily), for about 8 weeks. We will follow-up in 6 months, unless she needs [place] sooner.

## 2015-06-27 ENCOUNTER — Encounter: Payer: Self-pay | Admitting: Family Medicine

## 2015-07-20 ENCOUNTER — Other Ambulatory Visit: Payer: Self-pay | Admitting: *Deleted

## 2015-07-20 MED ORDER — ATORVASTATIN CALCIUM 10 MG PO TABS: 10.0000 mg | ORAL_TABLET | Freq: Every day | ORAL | Status: DC

## 2015-07-20 NOTE — Telephone Encounter (Signed)
Atorvastatin refilled per refill protocol

## 2015-07-27 ENCOUNTER — Ambulatory Visit
Admission: RE | Admit: 2015-07-27 | Discharge: 2015-07-27 | Disposition: A | Discharge status: Home / Self Care | Payer: BLUE CROSS/BLUE SHIELD | Source: Ambulatory Visit | Attending: Family Medicine | Admitting: Family Medicine

## 2015-07-27 DIAGNOSIS — Z1239 Encounter for other screening for malignant neoplasm of breast: Secondary | ICD-10-CM

## 2015-10-30 ENCOUNTER — Other Ambulatory Visit: Payer: Self-pay | Admitting: *Deleted

## 2015-10-30 MED ORDER — ATORVASTATIN CALCIUM 10 MG PO TABS: 10.0000 mg | ORAL_TABLET | Freq: Every day | ORAL | Status: DC

## 2015-12-07 NOTE — Progress Notes (Signed)
This encounter was created in error - please disregard.

## 2015-12-12 ENCOUNTER — Telehealth: Payer: Self-pay | Admitting: *Deleted

## 2015-12-12 MED ORDER — ATORVASTATIN CALCIUM 10 MG PO TABS: 10.0000 mg | ORAL_TABLET | Freq: Every day | ORAL | Status: DC

## 2015-12-12 NOTE — Telephone Encounter (Signed)
Patient given refill on atorvastatin for 30 day supply must have office visit prior to anymore refills.

## 2015-12-19 NOTE — Telephone Encounter (Signed)
Patient declined appt, said she had her bloodwork done which she had waited 30 minutes for.

## 2015-12-19 NOTE — Telephone Encounter (Signed)
Patient did say she would schedule her physical later

## 2016-06-13 ENCOUNTER — Other Ambulatory Visit: Payer: Self-pay | Admitting: Physician Assistant

## 2016-06-13 ENCOUNTER — Other Ambulatory Visit (HOSPITAL_COMMUNITY)
Admission: RE | Admit: 2016-06-13 | Discharge: 2016-06-13 | Disposition: A | Discharge status: Home / Self Care | Payer: BLUE CROSS/BLUE SHIELD | Source: Ambulatory Visit | Attending: Family Medicine | Admitting: Family Medicine

## 2016-06-13 DIAGNOSIS — Z124 Encounter for screening for malignant neoplasm of cervix: Secondary | ICD-10-CM | POA: Insufficient documentation

## 2016-06-17 LAB — CYTOLOGY - PAP

## 2017-04-25 ENCOUNTER — Ambulatory Visit (INDEPENDENT_AMBULATORY_CARE_PROVIDER_SITE_OTHER): Payer: 59

## 2017-04-25 ENCOUNTER — Encounter: Payer: Self-pay | Admitting: Physician Assistant

## 2017-04-25 ENCOUNTER — Ambulatory Visit (INDEPENDENT_AMBULATORY_CARE_PROVIDER_SITE_OTHER): Payer: 59 | Admitting: Physician Assistant

## 2017-04-25 ENCOUNTER — Emergency Department (HOSPITAL_COMMUNITY): Payer: 59

## 2017-04-25 ENCOUNTER — Encounter (HOSPITAL_COMMUNITY): Payer: Self-pay | Admitting: *Deleted

## 2017-04-25 ENCOUNTER — Emergency Department (HOSPITAL_COMMUNITY)
Admission: EM | Admit: 2017-04-25 | Discharge: 2017-04-25 | Disposition: A | Discharge status: Home / Self Care | Payer: 59 | Attending: Emergency Medicine | Admitting: Emergency Medicine

## 2017-04-25 VITALS — BP 136/85 | HR 96 | Temp 97.9°F | Resp 18 | Ht 63.0 in | Wt 184.0 lb

## 2017-04-25 DIAGNOSIS — D696 Thrombocytopenia, unspecified: Secondary | ICD-10-CM

## 2017-04-25 DIAGNOSIS — R079 Chest pain, unspecified: Secondary | ICD-10-CM

## 2017-04-25 DIAGNOSIS — M542 Cervicalgia: Secondary | ICD-10-CM | POA: Insufficient documentation

## 2017-04-25 DIAGNOSIS — Z79899 Other long term (current) drug therapy: Secondary | ICD-10-CM | POA: Diagnosis not present

## 2017-04-25 DIAGNOSIS — R42 Dizziness and giddiness: Secondary | ICD-10-CM

## 2017-04-25 DIAGNOSIS — J9811 Atelectasis: Secondary | ICD-10-CM | POA: Diagnosis not present

## 2017-04-25 DIAGNOSIS — E785 Hyperlipidemia, unspecified: Secondary | ICD-10-CM | POA: Insufficient documentation

## 2017-04-25 DIAGNOSIS — Z791 Long term (current) use of non-steroidal anti-inflammatories (NSAID): Secondary | ICD-10-CM | POA: Insufficient documentation

## 2017-04-25 DIAGNOSIS — E039 Hypothyroidism, unspecified: Secondary | ICD-10-CM | POA: Diagnosis not present

## 2017-04-25 DIAGNOSIS — R0789 Other chest pain: Secondary | ICD-10-CM | POA: Insufficient documentation

## 2017-04-25 DIAGNOSIS — M79622 Pain in left upper arm: Secondary | ICD-10-CM | POA: Diagnosis not present

## 2017-04-25 LAB — CBC WITH DIFFERENTIAL/PLATELET
Band Neutrophils: 0 %
Basophils Absolute: 0 10*3/uL (ref 0.0–0.1)
Basophils Relative: 0 %
Blasts: 0 %
Eosinophils Absolute: 0 10*3/uL (ref 0.0–0.7)
Eosinophils Relative: 0 %
HCT: 36.3 % (ref 36.0–46.0)
Hemoglobin: 12.1 g/dL (ref 12.0–15.0)
Lymphocytes Relative: 55 %
Lymphs Abs: 5.2 10*3/uL — ABNORMAL HIGH (ref 0.7–4.0)
MCH: 28.2 pg (ref 26.0–34.0)
MCHC: 33.3 g/dL (ref 30.0–36.0)
MCV: 84.6 fL (ref 78.0–100.0)
Metamyelocytes Relative: 0 %
Monocytes Absolute: 0.4 10*3/uL (ref 0.1–1.0)
Monocytes Relative: 4 %
Myelocytes: 0 %
Neutro Abs: 3.9 10*3/uL (ref 1.7–7.7)
Neutrophils Relative %: 39 %
Other: 0 %
Platelets: 33 10*3/uL — ABNORMAL LOW (ref 150–400)
Promyelocytes Absolute: 2 %
RBC: 4.29 MIL/uL (ref 3.87–5.11)
RDW: 14.4 % (ref 11.5–15.5)
WBC: 9.5 10*3/uL (ref 4.0–10.5)
nRBC: 0 /100 WBC

## 2017-04-25 LAB — HEPATIC FUNCTION PANEL
ALK PHOS: 73 U/L (ref 38–126)
ALT: 36 U/L (ref 14–54)
AST: 44 U/L — AB (ref 15–41)
Albumin: 3.5 g/dL (ref 3.5–5.0)
BILIRUBIN TOTAL: 0.4 mg/dL (ref 0.3–1.2)
Total Protein: 7.1 g/dL (ref 6.5–8.1)

## 2017-04-25 LAB — POCT CBC
GRANULOCYTE PERCENT: 31.6 % — AB (ref 37–80)
HCT, POC: 35 % — AB (ref 37.7–47.9)
HEMOGLOBIN: 12.1 g/dL — AB (ref 12.2–16.2)
Lymph, poc: 5.1 — AB (ref 0.6–3.4)
MCH: 28.9 pg (ref 27–31.2)
MCHC: 34.7 g/dL (ref 31.8–35.4)
MCV: 83.2 fL (ref 80–97)
MID (CBC): 0.9 (ref 0–0.9)
MPV: 8.8 fL (ref 0–99.8)
PLATELET COUNT, POC: 45 10*3/uL — AB (ref 142–424)
POC Granulocyte: 2.8 (ref 2–6.9)
POC LYMPH PERCENT: 58.3 %L — AB (ref 10–50)
POC MID %: 10.1 %M (ref 0–12)
RBC: 4.2 M/uL (ref 4.04–5.48)
RDW, POC: 14.4 %
WBC: 8.8 10*3/uL (ref 4.6–10.2)

## 2017-04-25 LAB — COMPREHENSIVE METABOLIC PANEL
ALK PHOS: 73 U/L (ref 38–126)
ALT: 40 U/L (ref 14–54)
AST: 48 U/L — AB (ref 15–41)
Albumin: 3.6 g/dL (ref 3.5–5.0)
Anion gap: 11 (ref 5–15)
BUN: 14 mg/dL (ref 6–20)
CHLORIDE: 105 mmol/L (ref 101–111)
CO2: 23 mmol/L (ref 22–32)
CREATININE: 0.84 mg/dL (ref 0.44–1.00)
Calcium: 9.6 mg/dL (ref 8.9–10.3)
GFR calc Af Amer: >60 mL/min (ref >60)
GFR calc non Af Amer: >60 mL/min (ref >60)
Glucose, Bld: 97 mg/dL (ref 65–99)
Potassium: 4.3 mmol/L (ref 3.5–5.1)
SODIUM: 139 mmol/L (ref 135–145)
Total Bilirubin: 0.6 mg/dL (ref 0.3–1.2)
Total Protein: 7.1 g/dL (ref 6.5–8.1)

## 2017-04-25 LAB — LACTATE DEHYDROGENASE: LDH: 1087 U/L — ABNORMAL HIGH (ref 98–192)

## 2017-04-25 LAB — TROPONIN I
Troponin I: <0.03 ng/mL (ref <0.03)
Troponin I: <0.03 ng/mL (ref <0.03)

## 2017-04-25 LAB — PROTIME-INR
INR: 1.02
Prothrombin Time: 13.4 seconds (ref 11.4–15.2)

## 2017-04-25 LAB — POCT GLYCOSYLATED HEMOGLOBIN (HGB A1C): HEMOGLOBIN A1C: 6.9

## 2017-04-25 LAB — GLUCOSE, POCT (MANUAL RESULT ENTRY): POC GLUCOSE: 107 mg/dL — AB (ref 70–99)

## 2017-04-25 LAB — APTT: aPTT: 29 seconds (ref 24–36)

## 2017-04-25 MED ORDER — ASPIRIN 81 MG PO CHEW
324.0000 mg | CHEWABLE_TABLET | Freq: Once | ORAL | Status: AC
  Administered 2017-04-25: 324 mg via ORAL

## 2017-04-25 MED ORDER — SODIUM CHLORIDE 0.9 % IV BOLUS (SEPSIS)
1000.0000 mL | Freq: Once | INTRAVENOUS | Status: AC
  Administered 2017-04-25: 1000 mL via INTRAVENOUS

## 2017-04-25 MED ORDER — KETOROLAC TROMETHAMINE 30 MG/ML IJ SOLN
30.0000 mg | Freq: Once | INTRAMUSCULAR | Status: AC
  Administered 2017-04-25: 30 mg via INTRAVENOUS
  Filled 2017-04-25: qty 1

## 2017-04-25 NOTE — ED Triage Notes (Signed)
Pt woke at 0630 this AM with Lt CP,Lt arm pain, and Lt Shoulder pain. Pt took 2 advil at home  With out relief. Pt went to [facility] . Pt was given 324mg   ASA at [facility].  During the time Pt was getting a X-ray Pt felt dizzy, light headed and was assisted to a sitting position and did not experance any loss of CON.

## 2017-04-25 NOTE — Patient Instructions (Signed)
     IF you received an x-ray today, you will receive an invoice from [facility]. Please contact [facility] at [phone] with questions or concerns regarding your invoice.   IF you received labwork today, you will receive an invoice from [facility]. Please contact [facility] at [phone] with questions or concerns regarding your invoice.   Our billing staff will not be able to assist you with questions regarding bills from these companies.  You will be contacted with the lab results as soon as they are available. The fastest way to get your results is to activate your My Chart account. Instructions are located on the last page of this paperwork. If you have not heard from us regarding the results in 2 weeks, please contact this office.     

## 2017-04-25 NOTE — ED Notes (Signed)
Pt verbalized understanding discharge instructions and denies any further needs or questions at this time. VS stable, ambulatory and steady gait.   

## 2017-04-25 NOTE — Discharge Instructions (Addendum)
Return to the ER if he develop any bleeding, significant head injury, fevers or worsening chest pain or symptoms. Follow-up closely with primary Dr. and hematology on Monday.  If you were given medicines take as directed.  If you are on coumadin or contraceptives realize their levels and effectiveness is altered by many different medicines.  If you have any reaction (rash, tongues swelling, other) to the medicines stop taking and see a physician.    If your blood pressure was elevated in the ER make sure you follow up for management with a primary doctor or return for chest pain, shortness of breath or stroke symptoms.  Please follow up as directed and return to the ER or see a physician for new or worsening symptoms.  Thank you. Vitals:   04/25/17 1030 04/25/17 1130 04/25/17 1421 04/25/17 1630  BP: 130/68 (!) 120/36 125/76 (!) 134/58  Pulse: 73  86 91  Resp: 16  17 17   Temp:      TempSrc:      SpO2: 99%  100% 97%  Weight:      Height:

## 2017-04-25 NOTE — Progress Notes (Signed)
04/25/2017 8:47 AM   DOB: [birth year] / MRN: [id number]  SUBJECTIVE:  Jill Berry is a 56 y.o. female presenting for chest pain that started one hour ago.  Tells me that the pain is sharp, radiates to the left shoulder and down the arm. Assoicates nausea and dizziness.   Pain is moderate.  No history of CAD, diabetes, HTN,, smoking.  Tells me she has a history of high cholesterol. She has tried two ibuprofen with no relief.   She has No Known Allergies.   She  has a past medical history of GERD (gastroesophageal reflux disease); Hyperlipidemia; Thyroid disease; and Urinary tract bacterial infections.    She  reports that she has never smoked. She has never used smokeless tobacco. She reports that she drinks alcohol. She reports that she does not use drugs. She  reports that she currently engages in sexual activity. The patient  has a past surgical history that includes Cesarean section; Cesarean section; Refractive surgery; and LASIK (Bilateral).  Her family history includes Alcohol abuse in her father and mother; Cancer in her maternal grandmother, mother, and son; Hyperlipidemia in her father and mother; Hypertension in her father and mother; Parkinson's disease in her maternal grandfather and paternal grandfather.  Review of Systems  Constitutional: Negative for chills and fever.  Gastrointestinal: Negative for nausea.  Neurological: Negative for dizziness.    The problem list and medications were reviewed and updated by myself where necessary and exist elsewhere in the encounter.   OBJECTIVE:  BP 136/85   Pulse 96   Temp 97.9 F (36.6 C) (Oral)   Resp 18   Ht 5\' 3"  (1.6 m)   Wt 184 lb (83.5 kg)   SpO2 96%   BMI 32.59 kg/m   Physical Exam  Constitutional: She is active.  Non-toxic appearance.  Cardiovascular: Normal rate, regular rhythm, S1 normal, S2 normal, normal heart sounds and intact distal pulses.  Exam reveals no gallop, no friction rub and no decreased pulses.     No murmur heard. Pulmonary/Chest: Effort normal. No stridor. No tachypnea. No respiratory distress. She has no wheezes. She has no rales. She exhibits tenderness (left sided chest tenderness about the superior aspect of the pectoralis major).  Abdominal: She exhibits no distension. There is no tenderness.  Musculoskeletal: She exhibits no edema.  Neurological: She is alert.  Skin: Skin is warm and dry. No rash noted. She is not diaphoretic. No erythema. There is pallor.  Psychiatric: She has a normal mood and affect.    Results for orders placed or performed in visit on 04/25/17 (from the past 72 hour(s))  POCT glucose (manual entry)     Status: Abnormal   Collection Time: 04/25/17  8:17 AM  Result Value Ref Range   POC Glucose 107 (A) 70 - 99 mg/dl  POCT CBC     Status: Abnormal   Collection Time: 04/25/17  8:24 AM  Result Value Ref Range   WBC 8.8 4.6 - 10.2 K/uL   Lymph, poc 5.1 (A) 0.6 - 3.4   POC LYMPH PERCENT 58.3 (A) 10 - 50 %L   MID (cbc) 0.9 0 - 0.9   POC MID % 10.1 0 - 12 %M   POC Granulocyte 2.8 2 - 6.9   Granulocyte percent 31.6 (A) 37 - 80 %G   RBC 4.20 4.04 - 5.48 M/uL   Hemoglobin 12.1 (A) 12.2 - 16.2 g/dL   HCT, POC 35.0 (A) 37.7 - 47.9 %   MCV  83.2 80 - 97 fL   MCH, POC 28.9 27 - 31.2 pg   MCHC 34.7 31.8 - 35.4 g/dL   RDW, POC 14.4 %   Platelet Count, POC 45 (A) 142 - 424 K/uL   MPV 8.8 0 - 99.8 fL  POCT glycosylated hemoglobin (Hb A1C)     Status: None   Collection Time: 04/25/17  8:27 AM  Result Value Ref Range   Hemoglobin A1C 6.9     Dg Chest 2 View  Result Date: 04/25/2017 CLINICAL DATA:  Left arm pain starting today EXAM: CHEST  2 VIEW COMPARISON:  None. FINDINGS: Mild atelectatic type opacities over the left diaphragm. There is no edema, consolidation, effusion, or pneumothorax. Normal heart size and mediastinal contours. No osseous findings. IMPRESSION: Mild atelectasis over the left diaphragm. Electronically Signed   By: Monte Fantasia M.D.   On:  04/25/2017 08:41   Dg Cervical Spine 2 Or 3 Views  Result Date: 04/25/2017 CLINICAL DATA:  Left arm pain started today EXAM: CERVICAL SPINE - 2-3 VIEW COMPARISON:  None. FINDINGS: There is no evidence of cervical spine fracture or prevertebral soft tissue swelling. Alignment is normal. No other significant bone abnormalities are identified. Bilateral facet arthropathy at C5-6, C6-7 C7-T1. Small old level lesion of the posterior tip of the C7 spinous process. IMPRESSION: No acute osseous injury of the cervical spine. Electronically Signed   By: Kathreen Devoid   On: 04/25/2017 08:42    ASSESSMENT AND PLAN:  Jill Berry was seen today for neck pain, jaw pain and fatigue.  Diagnoses and all orders for this visit:  Chest pain, unspecified type: She had dizziness here and was presyncopal in xray.  Her chest tenderness and EKG points away from a cardiac etiology however presye and chest needs to be monitored in the ED.  Emergently sent to the ED.  324 of asa given.  IV started in the left hand. We were unable to get a urine today. 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  -     EKG 12-Lead -     POCT glucose (manual entry) -     CBC -     DG Chest 2 View; Future -     POCT glycosylated hemoglobin (Hb A1C) -     POCT CBC -     DG Cervical Spine 2 or 3 views; Future -     Epstein-Barr virus VCA antibody panel -     CMV abs, IgG+IgM (cytomegalovirus)    The patient is advised to call or return to clinic if she does not see an improvement in symptoms, or to seek the care of the closest emergency department if she worsens with the above plan.   Philis Fendt, MHS, PA-C [facility] 04/25/2017 8:47 AM

## 2017-04-25 NOTE — Addendum Note (Signed)
Addended by: Carter Kitten on: 04/25/2017 09:19 AM   Modules accepted: Orders

## 2017-04-25 NOTE — ED Provider Notes (Signed)
[facility] DEPT Provider Note   CSN: [id number] Arrival date & time: 04/25/17  4431     History   Chief Complaint Chief Complaint  Patient presents with  . Chest Pain    HPI Jill Berry is a 56 y.o. female.  HPI Patient presents to the emergency department with neck pain that radiated into her left shoulder and arm.  The patient states she felt some tingling in her fingers as well.  She states that she did have potentially some mild upper left chest pain, near the shoulder.  Patient says she did not have any shortness of breath, nausea, vomiting, weakness, dizziness, headache, blurred vision, back pain, fever, dysuria, incontinence, abdominal pain or syncope.  Patient states she did not get somewhat dizzy and sweaty when she was at the urgent care.  She felt like she was having increasing pain at that point in the shoulder, neck.  Past Medical History:  Diagnosis Date  . GERD (gastroesophageal reflux disease)   . Hyperlipidemia   . Thyroid disease   . Urinary tract bacterial infections     Patient Active Problem List   Diagnosis Date Noted  . Hyperlipidemia 06/11/2015  . Hypothyroidism 01/10/2014  . Low T4 12/02/2013  . Ceruminosis 12/02/2013  . Vitamin D deficiency 12/02/2013  . Pre-diabetes 12/02/2013  . Preventative health care 11/25/2013  . Breast cancer screening 11/25/2013  . Cervical cancer screening 11/25/2013    Past Surgical History:  Procedure Laterality Date  . CESAREAN SECTION    . CESAREAN SECTION    . LASIK Bilateral   . REFRACTIVE SURGERY      OB History    No data available       Home Medications    Prior to Admission medications   Medication Sig Start Date End Date Taking? Authorizing Provider  cholecalciferol (VITAMIN D) 1000 UNITS tablet Take 1,000 Units by mouth daily.    [provider]  ibuprofen (ADVIL,MOTRIN) 200 MG tablet Take 200 mg by mouth every 6 (six) hours as needed.    [provider]  levothyroxine  (SYNTHROID, LEVOTHROID) 50 MCG tablet Take 1 tablet (50 mcg total) by mouth daily before breakfast. 06/11/15   Raoul Pitch, Renee A, DO    Family History Family History  Problem Relation Age of Onset  . Alcohol abuse Mother   . Cancer Mother        bladder, smoker  . Hyperlipidemia Mother   . Hypertension Mother   . Alcohol abuse Father   . Hyperlipidemia Father   . Hypertension Father   . Cancer Son        medullary thyroid   . Cancer Maternal Grandmother        stomach  . Parkinson's disease Maternal Grandfather   . Parkinson's disease Paternal Grandfather     Social History Social History  Substance Use Topics  . Smoking status: Never Smoker  . Smokeless tobacco: Never Used  . Alcohol use Yes     Comment: occasional     Allergies   Patient has no known allergies.   Review of Systems Review of Systems  All other systems negative except as documented in the HPI. All pertinent positives and negatives as reviewed in the HPI. Physical Exam Updated Vital Signs BP 125/75 (BP Location: Right Arm)   Pulse 78   Temp 98.1 F (36.7 C) (Oral)   Resp 16   Ht 5\' 2"  (1.575 m)   Wt 83.5 kg (184 lb)   LMP 04/18/2017  Comment: irregular  SpO2 99%   BMI 33.65 kg/m   Physical Exam  Constitutional: She is oriented to person, place, and time. She appears well-developed and well-nourished. No distress.  HENT:  Head: Normocephalic and atraumatic.  Mouth/Throat: Oropharynx is clear and moist.  Eyes: Pupils are equal, round, and reactive to light.  Neck: Normal range of motion. Neck supple.    Cardiovascular: Normal rate, regular rhythm and normal heart sounds.  Exam reveals no gallop and no friction rub.   No murmur heard. Pulmonary/Chest: Effort normal and breath sounds normal. No respiratory distress. She has no wheezes.  Abdominal: Soft. Bowel sounds are normal. She exhibits no distension. There is no tenderness. There is no guarding.  Musculoskeletal:       Cervical back:  She exhibits tenderness and pain. She exhibits normal range of motion, no bony tenderness, no swelling, no edema and no deformity.  Neurological: She is alert and oriented to person, place, and time. She exhibits normal muscle tone. Coordination normal.  Skin: Skin is warm and dry. Capillary refill takes less than 2 seconds. No rash noted. No erythema.  Psychiatric: She has a normal mood and affect. Her behavior is normal.  Nursing note and vitals reviewed.    ED Treatments / Results  Labs (all labs ordered are listed, but only abnormal results are displayed) Labs Reviewed  CBC WITH DIFFERENTIAL/PLATELET    EKG  EKG Interpretation  Date/Time:  Saturday April 25 2017 09:32:24 EDT Ventricular Rate:  77 PR Interval:    QRS Duration: 91 QT Interval:  361 QTC Calculation: 409 R Axis:   71 Text Interpretation:  Sinus rhythm no acute ST/T changes No old tracing to compare Confirmed by Sherwood Gambler [phone]) on 04/25/2017 9:43:22 AM Also confirmed by Sherwood Gambler [phone]), editor Drema Pry [phone])  on 04/25/2017 10:35:03 AM       Radiology Dg Chest 2 View  Result Date: 04/25/2017 CLINICAL DATA:  Left arm pain starting today EXAM: CHEST  2 VIEW COMPARISON:  None. FINDINGS: Mild atelectatic type opacities over the left diaphragm. There is no edema, consolidation, effusion, or pneumothorax. Normal heart size and mediastinal contours. No osseous findings. IMPRESSION: Mild atelectasis over the left diaphragm. Electronically Signed   By: Monte Fantasia M.D.   On: 04/25/2017 08:41   Dg Cervical Spine 2 Or 3 Views  Result Date: 04/25/2017 CLINICAL DATA:  Left arm pain started today EXAM: CERVICAL SPINE - 2-3 VIEW COMPARISON:  None. FINDINGS: There is no evidence of cervical spine fracture or prevertebral soft tissue swelling. Alignment is normal. No other significant bone abnormalities are identified. Bilateral facet arthropathy at C5-6, C6-7 C7-T1. Small old level lesion of the  posterior tip of the C7 spinous process. IMPRESSION: No acute osseous injury of the cervical spine. Electronically Signed   By: Kathreen Devoid   On: 04/25/2017 08:42    Procedures Procedures (including critical care time)  Medications Ordered in ED Medications  sodium chloride 0.9 % bolus 1,000 mL (not administered)     Initial Impression / Assessment and Plan / ED Course  I have reviewed the triage vital signs and the nursing notes.  Pertinent labs & imaging results that were available during my care of the patient were reviewed by me and considered in my medical decision making (see chart for details).    I feel the patient's pain is most likely related to a musculoskeletal issue rather than a cardiac-related issue.  Her symptoms be pretty atypical for cardiac chest  pain as the pain is mostly in the shoulder and neck region and not anteriorly, but more posterior lateral.  I feel the pain caused some of the near-syncope symptoms for the patient at the urgent care  Final Clinical Impressions(s) / ED Diagnoses   Final diagnoses:  None    New Prescriptions New Prescriptions   No medications on file     Dalia Heading, Hershal Coria 04/25/17 1605    Elnora Morrison, MD 04/25/17 [phone]

## 2017-04-26 ENCOUNTER — Emergency Department (HOSPITAL_COMMUNITY)
Admission: EM | Admit: 2017-04-26 | Discharge: 2017-04-26 | Disposition: A | Discharge status: Other Acute Inpatient Hospital | Payer: 59 | Attending: Emergency Medicine | Admitting: Emergency Medicine

## 2017-04-26 ENCOUNTER — Encounter (HOSPITAL_COMMUNITY): Payer: Self-pay | Admitting: Emergency Medicine

## 2017-04-26 DIAGNOSIS — M79602 Pain in left arm: Secondary | ICD-10-CM | POA: Diagnosis not present

## 2017-04-26 DIAGNOSIS — J181 Lobar pneumonia, unspecified organism: Secondary | ICD-10-CM | POA: Diagnosis not present

## 2017-04-26 DIAGNOSIS — D72820 Lymphocytosis (symptomatic): Secondary | ICD-10-CM | POA: Diagnosis not present

## 2017-04-26 DIAGNOSIS — D47Z1 Post-transplant lymphoproliferative disorder (PTLD): Secondary | ICD-10-CM | POA: Diagnosis not present

## 2017-04-26 DIAGNOSIS — J90 Pleural effusion, not elsewhere classified: Secondary | ICD-10-CM | POA: Diagnosis not present

## 2017-04-26 DIAGNOSIS — R918 Other nonspecific abnormal finding of lung field: Secondary | ICD-10-CM | POA: Diagnosis not present

## 2017-04-26 DIAGNOSIS — C9500 Acute leukemia of unspecified cell type not having achieved remission: Secondary | ICD-10-CM | POA: Diagnosis not present

## 2017-04-26 DIAGNOSIS — R799 Abnormal finding of blood chemistry, unspecified: Secondary | ICD-10-CM | POA: Diagnosis present

## 2017-04-26 DIAGNOSIS — D696 Thrombocytopenia, unspecified: Secondary | ICD-10-CM | POA: Diagnosis not present

## 2017-04-26 DIAGNOSIS — D479 Neoplasm of uncertain behavior of lymphoid, hematopoietic and related tissue, unspecified: Secondary | ICD-10-CM | POA: Diagnosis not present

## 2017-04-26 DIAGNOSIS — J189 Pneumonia, unspecified organism: Secondary | ICD-10-CM | POA: Diagnosis not present

## 2017-04-26 DIAGNOSIS — E039 Hypothyroidism, unspecified: Secondary | ICD-10-CM | POA: Diagnosis not present

## 2017-04-26 DIAGNOSIS — C9100 Acute lymphoblastic leukemia not having achieved remission: Secondary | ICD-10-CM | POA: Diagnosis not present

## 2017-04-26 DIAGNOSIS — D61810 Antineoplastic chemotherapy induced pancytopenia: Secondary | ICD-10-CM | POA: Diagnosis not present

## 2017-04-26 DIAGNOSIS — Z79899 Other long term (current) drug therapy: Secondary | ICD-10-CM | POA: Diagnosis not present

## 2017-04-26 DIAGNOSIS — R079 Chest pain, unspecified: Secondary | ICD-10-CM | POA: Insufficient documentation

## 2017-04-26 DIAGNOSIS — Z5111 Encounter for antineoplastic chemotherapy: Secondary | ICD-10-CM | POA: Diagnosis not present

## 2017-04-26 DIAGNOSIS — T8690 Unspecified complication of unspecified transplanted organ and tissue: Secondary | ICD-10-CM | POA: Diagnosis not present

## 2017-04-26 DIAGNOSIS — D649 Anemia, unspecified: Secondary | ICD-10-CM | POA: Diagnosis not present

## 2017-04-26 DIAGNOSIS — J159 Unspecified bacterial pneumonia: Secondary | ICD-10-CM | POA: Diagnosis not present

## 2017-04-26 LAB — CMP AND LIVER
ALT: 36 IU/L — ABNORMAL HIGH (ref 0–32)
AST: 43 IU/L — AB (ref 0–40)
Albumin: 4 g/dL (ref 3.5–5.5)
Alkaline Phosphatase: 83 IU/L (ref 39–117)
BUN: 14 mg/dL (ref 6–24)
Bilirubin Total: <0.2 mg/dL (ref 0.0–1.2)
Bilirubin, Direct: 0.07 mg/dL (ref 0.00–0.40)
CALCIUM: 9.7 mg/dL (ref 8.7–10.2)
CHLORIDE: 102 mmol/L (ref 96–106)
CO2: 24 mmol/L (ref 20–29)
Creatinine, Ser: 0.92 mg/dL (ref 0.57–1.00)
GFR calc Af Amer: 82 mL/min/{1.73_m2} (ref >59)
GFR, EST NON AFRICAN AMERICAN: 71 mL/min/{1.73_m2} (ref >59)
GLUCOSE: 116 mg/dL — AB (ref 65–99)
POTASSIUM: 4.4 mmol/L (ref 3.5–5.2)
Sodium: 142 mmol/L (ref 134–144)
Total Protein: 6.6 g/dL (ref 6.0–8.5)

## 2017-04-26 LAB — COMPREHENSIVE METABOLIC PANEL
ALBUMIN: 3.9 g/dL (ref 3.5–5.0)
ALT: 34 U/L (ref 14–54)
ANION GAP: 9 (ref 5–15)
AST: 48 U/L — ABNORMAL HIGH (ref 15–41)
Alkaline Phosphatase: 71 U/L (ref 38–126)
BUN: 13 mg/dL (ref 6–20)
CO2: 25 mmol/L (ref 22–32)
Calcium: 9.7 mg/dL (ref 8.9–10.3)
Chloride: 108 mmol/L (ref 101–111)
Creatinine, Ser: 0.87 mg/dL (ref 0.44–1.00)
GFR calc non Af Amer: >60 mL/min (ref >60)
GLUCOSE: 93 mg/dL (ref 65–99)
POTASSIUM: 4.2 mmol/L (ref 3.5–5.1)
SODIUM: 142 mmol/L (ref 135–145)
TOTAL PROTEIN: 7.5 g/dL (ref 6.5–8.1)
Total Bilirubin: 0.6 mg/dL (ref 0.3–1.2)

## 2017-04-26 LAB — EPSTEIN-BARR VIRUS VCA ANTIBODY PANEL
EBV EARLY ANTIGEN AB, IGG: 44.4 U/mL — AB (ref 0.0–8.9)
EBV NA IgG: 598 U/mL — ABNORMAL HIGH (ref 0.0–17.9)
EBV VCA IgG: 360 U/mL — ABNORMAL HIGH (ref 0.0–17.9)

## 2017-04-26 LAB — CBC
HEMOGLOBIN: 12 g/dL (ref 11.1–15.9)
Hematocrit: 35.9 % (ref 34.0–46.6)
MCH: 28.4 pg (ref 26.6–33.0)
MCHC: 33.4 g/dL (ref 31.5–35.7)
MCV: 85 fL (ref 79–97)
NRBC: 11 % — ABNORMAL HIGH (ref 0–0)
PLATELETS: 43 10*3/uL — AB (ref 150–379)
RBC: 4.23 x10E6/uL (ref 3.77–5.28)
RDW: 15 % (ref 12.3–15.4)
WBC: 8.7 10*3/uL (ref 3.4–10.8)

## 2017-04-26 LAB — CBC WITH DIFFERENTIAL/PLATELET
BASOS ABS: 0 10*3/uL (ref 0.0–0.1)
BASOS PCT: 0 %
EOS ABS: 0.2 10*3/uL (ref 0.0–0.7)
EOS PCT: 2 %
HCT: 37.7 % (ref 36.0–46.0)
Hemoglobin: 12.7 g/dL (ref 12.0–15.0)
LYMPHS PCT: 56 %
Lymphs Abs: 4.8 10*3/uL — ABNORMAL HIGH (ref 0.7–4.0)
MCH: 28.5 pg (ref 26.0–34.0)
MCHC: 33.7 g/dL (ref 30.0–36.0)
MCV: 84.7 fL (ref 78.0–100.0)
MONO ABS: 0.3 10*3/uL (ref 0.1–1.0)
MONOS PCT: 4 %
NEUTROS PCT: 25 %
Neutro Abs: 2.2 10*3/uL (ref 1.7–7.7)
Other: 13 %
PLATELETS: 34 10*3/uL — AB (ref 150–400)
RBC: 4.45 MIL/uL (ref 3.87–5.11)
RDW: 14.7 % (ref 11.5–15.5)
WBC: 8.6 10*3/uL (ref 4.0–10.5)

## 2017-04-26 LAB — LACTATE DEHYDROGENASE: LDH: 1243 U/L — AB (ref 98–192)

## 2017-04-26 LAB — SAVE SMEAR

## 2017-04-26 LAB — CMV ABS, IGG+IGM (CYTOMEGALOVIRUS)
CMV Ab - IgG: <0.6 U/mL (ref 0.00–0.59)
CMV IgM Ser EIA-aCnc: <30 AU/mL (ref 0.0–29.9)

## 2017-04-26 NOTE — ED Triage Notes (Signed)
Pt sent by Dr. Benay Spice for abnormal labs. Pt was to see MD tomorrow for abnormal labs r/t recent visit at [facility]. Pt with low platelets and Dr. Benay Spice reviewed other labs patient had repeated at discharge and told patient to come to [facility] immediately. Pt had initially been seen for L shoulder and L rib pain. Pt states she continues to have pain and has taken ibuprofen for pain, but pain continues. Pt also c/o headache.

## 2017-04-26 NOTE — Discharge Instructions (Signed)
Go to [facility].

## 2017-04-26 NOTE — ED Provider Notes (Signed)
[facility] DEPT Provider Note   CSN: [id number] Arrival date & time: 04/26/17  2951     History   Chief Complaint Chief Complaint  Patient presents with  . Abnormal Lab    HPI Jill Berry is a 56 y.o. female.  Pt presents to the ED with abnormal labs.  The pt was seen at [facility] ED yesterday for chest pain.  The cardiac work up was fine, but during the course of the work up, her platelets were noted to be low.  The PA called hematology who requested some other labs.  The LDH came back elevated, so Dr. Benay Spice called pt and told her to come here.  He then called me to ask [place] to draw a CBC, save smear, chem panel, and ldh.  Pt said her cp has improved after ibuprofen.  The pt denies any nose bleeds.  She does mention that she has bleeding when she brushes her teeth.      Past Medical History:  Diagnosis Date  . GERD (gastroesophageal reflux disease)   . Hyperlipidemia   . Thyroid disease   . Urinary tract bacterial infections     Patient Active Problem List   Diagnosis Date Noted  . Hyperlipidemia 06/11/2015  . Hypothyroidism 01/10/2014  . Low T4 12/02/2013  . Ceruminosis 12/02/2013  . Vitamin D deficiency 12/02/2013  . Pre-diabetes 12/02/2013  . Preventative health care 11/25/2013  . Breast cancer screening 11/25/2013  . Cervical cancer screening 11/25/2013    Past Surgical History:  Procedure Laterality Date  . CESAREAN SECTION    . CESAREAN SECTION    . LASIK Bilateral   . REFRACTIVE SURGERY      OB History    No data available       Home Medications    Prior to Admission medications   Medication Sig Start Date End Date Taking? Authorizing Provider  ibuprofen (ADVIL,MOTRIN) 200 MG tablet Take 800 mg by mouth every 6 (six) hours as needed for headache, mild pain or moderate pain.    Yes [provider]  levothyroxine (SYNTHROID, LEVOTHROID) 50 MCG tablet Take 1 tablet (50 mcg total) by mouth daily before breakfast. 06/11/15  Yes Kuneff, Renee  A, DO  Multiple Vitamins-Minerals (MULTIVITAMIN WITH MINERALS) tablet Take 1 tablet by mouth daily.   Yes [provider]    Family History Family History  Problem Relation Age of Onset  . Alcohol abuse Mother   . Cancer Mother        bladder, smoker  . Hyperlipidemia Mother   . Hypertension Mother   . Alcohol abuse Father   . Hyperlipidemia Father   . Hypertension Father   . Cancer Son        medullary thyroid   . Cancer Maternal Grandmother        stomach  . Parkinson's disease Maternal Grandfather   . Parkinson's disease Paternal Grandfather     Social History Social History  Substance Use Topics  . Smoking status: Never Smoker  . Smokeless tobacco: Never Used  . Alcohol use Yes     Comment: occasional     Allergies   Patient has no known allergies.   Review of Systems Review of Systems  Cardiovascular: Positive for chest pain.  All other systems reviewed and are negative.    Physical Exam Updated Vital Signs BP (!) 148/93 (BP Location: Left Arm)   Pulse 84   Temp 97.7 F (36.5 C) (Oral)   Resp 20  LMP 04/18/2017 Comment: irregular  SpO2 100%   Physical Exam  Constitutional: She is oriented to person, place, and time. She appears well-developed and well-nourished.  HENT:  Head: Normocephalic and atraumatic.  Right Ear: External ear normal.  Left Ear: External ear normal.  Nose: Nose normal.  Mouth/Throat: Oropharynx is clear and moist.  Eyes: Pupils are equal, round, and reactive to light. Conjunctivae and EOM are normal.  Neck: Normal range of motion. Neck supple.  Cardiovascular: Normal rate, regular rhythm, normal heart sounds and intact distal pulses.   Pulmonary/Chest: Effort normal and breath sounds normal.  Abdominal: Soft. Bowel sounds are normal.  Musculoskeletal: Normal range of motion.  Neurological: She is alert and oriented to person, place, and time.  Skin: Skin is warm.  Psychiatric: She has a normal mood and affect.  Her behavior is normal. Judgment and thought content normal.  Nursing note and vitals reviewed.    ED Treatments / Results  Labs (all labs ordered are listed, but only abnormal results are displayed) Labs Reviewed  CBC WITH DIFFERENTIAL/PLATELET - Abnormal; Notable for the following:       Result Value   Platelets 34 (*)    Lymphs Abs 4.8 (*)    All other components within normal limits  COMPREHENSIVE METABOLIC PANEL - Abnormal; Notable for the following:    AST 48 (*)    All other components within normal limits  LACTATE DEHYDROGENASE - Abnormal; Notable for the following:    LDH 1,243 (*)    All other components within normal limits  SAVE SMEAR  URINALYSIS, ROUTINE W REFLEX MICROSCOPIC  PATHOLOGIST SMEAR REVIEW    EKG  EKG Interpretation None       Radiology Dg Chest 2 View  Result Date: 04/25/2017 CLINICAL DATA:  Left arm pain starting today EXAM: CHEST  2 VIEW COMPARISON:  None. FINDINGS: Mild atelectatic type opacities over the left diaphragm. There is no edema, consolidation, effusion, or pneumothorax. Normal heart size and mediastinal contours. No osseous findings. IMPRESSION: Mild atelectasis over the left diaphragm. Electronically Signed   By: Monte Fantasia M.D.   On: 04/25/2017 08:41   Dg Cervical Spine 2-3 Views  Result Date: 04/25/2017 CLINICAL DATA:  Left arm pain beginning today. EXAM: CERVICAL SPINE - 2-3 VIEW COMPARISON:  Compared to AP and lateral views from earlier today. FINDINGS: The left neural foramina are patent. No significant narrowing of the right-sided neural foramina. There may be mild narrowing of the C3-4 neural foramen on the right. IMPRESSION: Mild narrowing of the right C3-4 neural foramen. No left-sided neural foraminal narrowing identified. Cross-sectional imaging would be more sensitive and specific. Electronically Signed   By: Dorise Bullion III M.D   On: 04/25/2017 11:39   Dg Cervical Spine 2 Or 3 Views  Result Date:  04/25/2017 CLINICAL DATA:  Left arm pain started today EXAM: CERVICAL SPINE - 2-3 VIEW COMPARISON:  None. FINDINGS: There is no evidence of cervical spine fracture or prevertebral soft tissue swelling. Alignment is normal. No other significant bone abnormalities are identified. Bilateral facet arthropathy at C5-6, C6-7 C7-T1. Small old level lesion of the posterior tip of the C7 spinous process. IMPRESSION: No acute osseous injury of the cervical spine. Electronically Signed   By: Kathreen Devoid   On: 04/25/2017 08:42    Procedures Procedures (including critical care time)  Medications Ordered in ED Medications - No data to display   Initial Impression / Assessment and Plan / ED Course  I have reviewed the  triage vital signs and the nursing notes.  Pertinent labs & imaging results that were available during my care of the patient were reviewed by me and considered in my medical decision making (see chart for details).    Dr. Benay Spice reviewed the cbc diff and was concerned for acute leukemia.  He spoke with Dr. Linus Orn who is on the leukemia service at [facility].  He will accept pt for transfer to [facility].  Pt's husband will drive her there as she is stable.    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

## 2017-04-26 NOTE — Consult Note (Signed)
New Hematology/Oncology Consult   Referral MP:[facility] [place]        Reason for Referral: Thrombocytopenia   HPI: She developed acute onset left neck and shoulder pain yesterday. She also had tingling in the left fingers. She was seen at an urgent care. She became dizzy and diaphoretic while at the urging care and was [facility].  X-rays of the cervical spine revealed mild narrowing of the right C3-4 foramen. A chest x-ray revealed mild atelectasis at the left diaphragm.  The pain improved. Troponins were negative. She was noted to have a platelet count of 33,000. Plans were initiated to arrange for an outpatient hematology consult on 04/27/2017. The emergency room physician obtained and LDH. This returned elevated at 1087.  She return to the [facility] emergency room for further evaluation.  She continues to have mild discomfort at the left shoulder, but this has improved. She has mild pleuritic discomfort at the left lower anterior chest today.  Missed also reports malaise for the past month. She had enlargement of the "tonsils "last week-this has resolved. No fever. She reports night sweats for several months. No bleeding.     Past Medical History:  Diagnosis Date  . GERD (gastroesophageal reflux disease)   . Hyperlipidemia   . Thyroid disease   . Urinary tract bacterial infections   :  Past Surgical History:  Procedure Laterality Date  . CESAREAN SECTION    . CESAREAN SECTION    . LASIK Bilateral   . REFRACTIVE SURGERY    :  No current facility-administered medications for this encounter.   Current Outpatient Prescriptions:  .  ibuprofen (ADVIL,MOTRIN) 200 MG tablet, Take 800 mg by mouth every 6 (six) hours as needed for headache, mild pain or moderate pain. , Disp: , Rfl:  .  levothyroxine (SYNTHROID, LEVOTHROID) 50 MCG tablet, Take 1 tablet (50 mcg total) by mouth daily before breakfast., Disp: 90 tablet,  Rfl: 1 .  Multiple Vitamins-Minerals (MULTIVITAMIN WITH MINERALS) tablet, Take 1 tablet by mouth daily., Disp: , Rfl: :  :  No Known Allergies:  FH:Her son has medullary thyroid carcinoma-sporadic. Her mother had bladder cancer.   SOCIAL HISTORY: [place] in [place]. She works in a [employer]. She does not smoke. No heavy alcohol use. She has been a blood donor.   Review of Systems:  Positives include: Malaise, night sweats, left low anterolateral chest discomfort today, left neck and shoulder pain yesterday-improved, "tonsil "swelling one week ago-improved  A complete ROS was otherwise negative.   Physical Exam:  Blood pressure (!) 148/93, pulse 84, temperature 97.7 F (36.5 C), temperature source Oral, resp. rate 20, last menstrual period 04/18/2017, SpO2 100 %.  HEENT: Oropharynx without visible mass, no apparent enlargement of the tonsils, neck without mass, no bleeding Lungs: End inspiratory rhonchi at the left posterior base that cleared after several respirations, no respiratory distress Cardiac: Regular rate and rhythm Abdomen: No hepatosplenomegaly, nontender  Vascular: No leg edema Lymph nodes: No cervical, supraclavicular, axillary, or inguinal nodes Neurologic: Alert and oriented, the motor exam appears intact in the upper and lower extremities Skin: No rash, ecchymoses, or petechiae Musculoskeletal: No spine tenderness, no pain with motion at the left shoulder  LABS:   Recent Labs  04/25/17 1017 04/26/17 1023  WBC 9.5 8.6  HGB 12.1 12.7  HCT 36.3 37.7  PLT 33* 34*  Blood smear-numerous nucleated red cells. The platelets are markedly decreased in number.  No platelet clumps. There is an increased number of mononuclear cells, most appear mature. Blast are present. A few of the blast have vacuoles. No Auer rods.  04/25/2017-PT 13.4, PTT 29    Recent Labs  04/25/17 1256 04/26/17 1023  NA 139 142  K 4.3 4.2  CL 105 108  CO2 23 25  GLUCOSE 97  93  BUN 14 13  CREATININE 0.84 0.87  CALCIUM 9.6 9.7   04/26/2017-AST 48, ALT 34, bilirubin 0.6, LDH 1243   RADIOLOGY:  Dg Chest 2 View  Result Date: 04/25/2017 CLINICAL DATA:  Left arm pain starting today EXAM: CHEST  2 VIEW COMPARISON:  None. FINDINGS: Mild atelectatic type opacities over the left diaphragm. There is no edema, consolidation, effusion, or pneumothorax. Normal heart size and mediastinal contours. No osseous findings. IMPRESSION: Mild atelectasis over the left diaphragm. Electronically Signed   By: Monte Fantasia M.D.   On: 04/25/2017 08:41   Dg Cervical Spine 2-3 Views  Result Date: 04/25/2017 CLINICAL DATA:  Left arm pain beginning today. EXAM: CERVICAL SPINE - 2-3 VIEW COMPARISON:  Compared to AP and lateral views from earlier today. FINDINGS: The left neural foramina are patent. No significant narrowing of the right-sided neural foramina. There may be mild narrowing of the C3-4 neural foramen on the right. IMPRESSION: Mild narrowing of the right C3-4 neural foramen. No left-sided neural foraminal narrowing identified. Cross-sectional imaging would be more sensitive and specific. Electronically Signed   By: Dorise Bullion III M.D   On: 04/25/2017 11:39   Dg Cervical Spine 2 Or 3 Views  Result Date: 04/25/2017 CLINICAL DATA:  Left arm pain started today EXAM: CERVICAL SPINE - 2-3 VIEW COMPARISON:  None. FINDINGS: There is no evidence of cervical spine fracture or prevertebral soft tissue swelling. Alignment is normal. No other significant bone abnormalities are identified. Bilateral facet arthropathy at C5-6, C6-7 C7-T1. Small old level lesion of the posterior tip of the C7 spinous process. IMPRESSION: No acute osseous injury of the cervical spine. Electronically Signed   By: Kathreen Devoid   On: 04/25/2017 08:42    Assessment and Plan:   1. Severe thrombocytopenia 2. Relative "lymphocytosis "-blasts noted on review the peripheral blood smear 3. Left neck/shoulder and  chest discomfort  [name] has severe thrombocytopenia. Review the peripheral blood smear is concerning for a diagnosis of acute leukemia. The differential diagnosis includes another lymphoproliferative disorder, and a leukoerythroblastic smear related to a different infiltrative bone marrow process.  I contacted Dr. Linus Orn on the leukemia service at [facility]. He agrees to accept [name] for direct hospital admission due to suspicion of acute leukemia. She appears stable for transfer to [facility] today.  I discussed the differential diagnosis and plan for admission at [facility] with [name] and her husband. She agrees to the hospital admission.  I am available to see her in the future as needed.    Donneta Romberg, MD 04/26/2017, 12:45 PM

## 2017-04-26 NOTE — ED Notes (Signed)
Dr. Sherrill at bedside 

## 2017-04-26 NOTE — ED Notes (Signed)
Per Dr. Gilford Raid, Pt will be direct admit to floor via POV at [facility] IV in hand secured. Dr. Gilford Raid will call pt and tell them where to go when bed is ready. Dr. Linus Orn accepting.

## 2017-04-27 LAB — PATHOLOGIST SMEAR REVIEW

## 2017-05-20 DIAGNOSIS — D688 Other specified coagulation defects: Secondary | ICD-10-CM | POA: Diagnosis not present

## 2017-05-20 DIAGNOSIS — R200 Anesthesia of skin: Secondary | ICD-10-CM | POA: Diagnosis not present

## 2017-05-20 DIAGNOSIS — Z5111 Encounter for antineoplastic chemotherapy: Secondary | ICD-10-CM | POA: Diagnosis not present

## 2017-05-20 DIAGNOSIS — C9100 Acute lymphoblastic leukemia not having achieved remission: Secondary | ICD-10-CM | POA: Diagnosis not present

## 2017-05-21 DIAGNOSIS — C9100 Acute lymphoblastic leukemia not having achieved remission: Secondary | ICD-10-CM | POA: Diagnosis not present

## 2017-05-22 DIAGNOSIS — C9100 Acute lymphoblastic leukemia not having achieved remission: Secondary | ICD-10-CM | POA: Diagnosis not present

## 2017-05-25 DIAGNOSIS — D649 Anemia, unspecified: Secondary | ICD-10-CM | POA: Diagnosis not present

## 2017-05-25 DIAGNOSIS — E039 Hypothyroidism, unspecified: Secondary | ICD-10-CM | POA: Diagnosis not present

## 2017-05-25 DIAGNOSIS — D688 Other specified coagulation defects: Secondary | ICD-10-CM | POA: Diagnosis not present

## 2017-05-25 DIAGNOSIS — D709 Neutropenia, unspecified: Secondary | ICD-10-CM | POA: Diagnosis not present

## 2017-05-25 DIAGNOSIS — C9100 Acute lymphoblastic leukemia not having achieved remission: Secondary | ICD-10-CM | POA: Diagnosis not present

## 2017-05-29 DIAGNOSIS — C9100 Acute lymphoblastic leukemia not having achieved remission: Secondary | ICD-10-CM | POA: Diagnosis not present

## 2017-06-01 DIAGNOSIS — C9101 Acute lymphoblastic leukemia, in remission: Secondary | ICD-10-CM | POA: Diagnosis not present

## 2017-06-01 DIAGNOSIS — C9100 Acute lymphoblastic leukemia not having achieved remission: Secondary | ICD-10-CM | POA: Diagnosis not present

## 2017-06-01 DIAGNOSIS — R946 Abnormal results of thyroid function studies: Secondary | ICD-10-CM | POA: Diagnosis not present

## 2017-06-01 DIAGNOSIS — Z006 Encounter for examination for normal comparison and control in clinical research program: Secondary | ICD-10-CM | POA: Diagnosis not present

## 2017-06-01 DIAGNOSIS — H6123 Impacted cerumen, bilateral: Secondary | ICD-10-CM | POA: Diagnosis not present

## 2017-06-03 DIAGNOSIS — Z79899 Other long term (current) drug therapy: Secondary | ICD-10-CM | POA: Diagnosis not present

## 2017-06-03 DIAGNOSIS — C9100 Acute lymphoblastic leukemia not having achieved remission: Secondary | ICD-10-CM | POA: Diagnosis not present

## 2017-06-05 DIAGNOSIS — C9100 Acute lymphoblastic leukemia not having achieved remission: Secondary | ICD-10-CM | POA: Diagnosis not present

## 2017-06-08 DIAGNOSIS — E039 Hypothyroidism, unspecified: Secondary | ICD-10-CM | POA: Diagnosis not present

## 2017-06-08 DIAGNOSIS — C9101 Acute lymphoblastic leukemia, in remission: Secondary | ICD-10-CM | POA: Diagnosis not present

## 2017-06-08 DIAGNOSIS — R0609 Other forms of dyspnea: Secondary | ICD-10-CM | POA: Diagnosis not present

## 2017-06-08 DIAGNOSIS — E785 Hyperlipidemia, unspecified: Secondary | ICD-10-CM | POA: Diagnosis not present

## 2017-06-11 DIAGNOSIS — Z006 Encounter for examination for normal comparison and control in clinical research program: Secondary | ICD-10-CM | POA: Diagnosis not present

## 2017-06-11 DIAGNOSIS — C9100 Acute lymphoblastic leukemia not having achieved remission: Secondary | ICD-10-CM | POA: Diagnosis not present

## 2017-06-15 DIAGNOSIS — C9101 Acute lymphoblastic leukemia, in remission: Secondary | ICD-10-CM | POA: Diagnosis not present

## 2017-06-15 DIAGNOSIS — Z5111 Encounter for antineoplastic chemotherapy: Secondary | ICD-10-CM | POA: Diagnosis not present

## 2017-06-15 DIAGNOSIS — E785 Hyperlipidemia, unspecified: Secondary | ICD-10-CM | POA: Diagnosis not present

## 2017-06-15 DIAGNOSIS — R251 Tremor, unspecified: Secondary | ICD-10-CM | POA: Diagnosis not present

## 2017-06-15 DIAGNOSIS — R600 Localized edema: Secondary | ICD-10-CM | POA: Diagnosis not present

## 2017-06-16 DIAGNOSIS — C9100 Acute lymphoblastic leukemia not having achieved remission: Secondary | ICD-10-CM | POA: Diagnosis not present

## 2017-06-16 DIAGNOSIS — Z006 Encounter for examination for normal comparison and control in clinical research program: Secondary | ICD-10-CM | POA: Diagnosis not present

## 2017-06-16 DIAGNOSIS — Z5111 Encounter for antineoplastic chemotherapy: Secondary | ICD-10-CM | POA: Diagnosis not present

## 2017-06-17 DIAGNOSIS — C9100 Acute lymphoblastic leukemia not having achieved remission: Secondary | ICD-10-CM | POA: Diagnosis not present

## 2017-06-17 DIAGNOSIS — Z5111 Encounter for antineoplastic chemotherapy: Secondary | ICD-10-CM | POA: Diagnosis not present

## 2017-06-18 DIAGNOSIS — C9100 Acute lymphoblastic leukemia not having achieved remission: Secondary | ICD-10-CM | POA: Diagnosis not present

## 2017-06-18 DIAGNOSIS — Z5111 Encounter for antineoplastic chemotherapy: Secondary | ICD-10-CM | POA: Diagnosis not present

## 2017-06-22 DIAGNOSIS — Z01818 Encounter for other preprocedural examination: Secondary | ICD-10-CM | POA: Diagnosis not present

## 2017-06-22 DIAGNOSIS — C9101 Acute lymphoblastic leukemia, in remission: Secondary | ICD-10-CM | POA: Diagnosis not present

## 2017-06-22 DIAGNOSIS — R251 Tremor, unspecified: Secondary | ICD-10-CM | POA: Diagnosis not present

## 2017-06-22 DIAGNOSIS — R739 Hyperglycemia, unspecified: Secondary | ICD-10-CM | POA: Diagnosis not present

## 2017-06-22 DIAGNOSIS — Z7189 Other specified counseling: Secondary | ICD-10-CM | POA: Diagnosis not present

## 2017-06-23 DIAGNOSIS — C9100 Acute lymphoblastic leukemia not having achieved remission: Secondary | ICD-10-CM | POA: Diagnosis not present

## 2017-06-23 DIAGNOSIS — Z79899 Other long term (current) drug therapy: Secondary | ICD-10-CM | POA: Diagnosis not present

## 2017-06-24 DIAGNOSIS — C9100 Acute lymphoblastic leukemia not having achieved remission: Secondary | ICD-10-CM | POA: Diagnosis not present

## 2017-06-24 DIAGNOSIS — Z79899 Other long term (current) drug therapy: Secondary | ICD-10-CM | POA: Diagnosis not present

## 2017-06-25 DIAGNOSIS — C9100 Acute lymphoblastic leukemia not having achieved remission: Secondary | ICD-10-CM | POA: Diagnosis not present

## 2017-06-25 DIAGNOSIS — Z79899 Other long term (current) drug therapy: Secondary | ICD-10-CM | POA: Diagnosis not present

## 2017-06-25 DIAGNOSIS — Z006 Encounter for examination for normal comparison and control in clinical research program: Secondary | ICD-10-CM | POA: Diagnosis not present

## 2017-06-29 DIAGNOSIS — C9101 Acute lymphoblastic leukemia, in remission: Secondary | ICD-10-CM | POA: Diagnosis not present

## 2017-06-29 DIAGNOSIS — Z006 Encounter for examination for normal comparison and control in clinical research program: Secondary | ICD-10-CM | POA: Diagnosis not present

## 2017-06-29 DIAGNOSIS — C9100 Acute lymphoblastic leukemia not having achieved remission: Secondary | ICD-10-CM | POA: Diagnosis not present

## 2017-06-29 DIAGNOSIS — R251 Tremor, unspecified: Secondary | ICD-10-CM | POA: Diagnosis not present

## 2017-06-29 DIAGNOSIS — R740 Nonspecific elevation of levels of transaminase and lactic acid dehydrogenase [LDH]: Secondary | ICD-10-CM | POA: Diagnosis not present

## 2017-07-02 DIAGNOSIS — C9100 Acute lymphoblastic leukemia not having achieved remission: Secondary | ICD-10-CM | POA: Diagnosis not present

## 2017-07-06 DIAGNOSIS — D709 Neutropenia, unspecified: Secondary | ICD-10-CM | POA: Diagnosis not present

## 2017-07-06 DIAGNOSIS — C9101 Acute lymphoblastic leukemia, in remission: Secondary | ICD-10-CM | POA: Diagnosis not present

## 2017-07-06 DIAGNOSIS — C9100 Acute lymphoblastic leukemia not having achieved remission: Secondary | ICD-10-CM | POA: Diagnosis not present

## 2017-07-06 DIAGNOSIS — D688 Other specified coagulation defects: Secondary | ICD-10-CM | POA: Diagnosis not present

## 2017-07-13 DIAGNOSIS — Z5111 Encounter for antineoplastic chemotherapy: Secondary | ICD-10-CM | POA: Diagnosis not present

## 2017-07-13 DIAGNOSIS — E785 Hyperlipidemia, unspecified: Secondary | ICD-10-CM | POA: Diagnosis not present

## 2017-07-13 DIAGNOSIS — C9101 Acute lymphoblastic leukemia, in remission: Secondary | ICD-10-CM | POA: Diagnosis not present

## 2017-07-13 DIAGNOSIS — C9100 Acute lymphoblastic leukemia not having achieved remission: Secondary | ICD-10-CM | POA: Diagnosis not present

## 2017-07-14 DIAGNOSIS — C9100 Acute lymphoblastic leukemia not having achieved remission: Secondary | ICD-10-CM | POA: Diagnosis not present

## 2017-07-14 DIAGNOSIS — Z79899 Other long term (current) drug therapy: Secondary | ICD-10-CM | POA: Diagnosis not present

## 2017-07-15 DIAGNOSIS — Z006 Encounter for examination for normal comparison and control in clinical research program: Secondary | ICD-10-CM | POA: Diagnosis not present

## 2017-07-15 DIAGNOSIS — Z79899 Other long term (current) drug therapy: Secondary | ICD-10-CM | POA: Diagnosis not present

## 2017-07-15 DIAGNOSIS — C9100 Acute lymphoblastic leukemia not having achieved remission: Secondary | ICD-10-CM | POA: Diagnosis not present

## 2017-07-16 DIAGNOSIS — C9100 Acute lymphoblastic leukemia not having achieved remission: Secondary | ICD-10-CM | POA: Diagnosis not present

## 2017-07-16 DIAGNOSIS — Z79899 Other long term (current) drug therapy: Secondary | ICD-10-CM | POA: Diagnosis not present

## 2017-07-20 DIAGNOSIS — C9101 Acute lymphoblastic leukemia, in remission: Secondary | ICD-10-CM | POA: Diagnosis not present

## 2017-07-20 DIAGNOSIS — C9100 Acute lymphoblastic leukemia not having achieved remission: Secondary | ICD-10-CM | POA: Diagnosis not present

## 2017-07-20 DIAGNOSIS — R200 Anesthesia of skin: Secondary | ICD-10-CM | POA: Diagnosis not present

## 2017-07-20 DIAGNOSIS — D688 Other specified coagulation defects: Secondary | ICD-10-CM | POA: Diagnosis not present

## 2017-07-20 DIAGNOSIS — R251 Tremor, unspecified: Secondary | ICD-10-CM | POA: Diagnosis not present

## 2017-07-20 DIAGNOSIS — Z006 Encounter for examination for normal comparison and control in clinical research program: Secondary | ICD-10-CM | POA: Diagnosis not present

## 2017-07-21 DIAGNOSIS — C9100 Acute lymphoblastic leukemia not having achieved remission: Secondary | ICD-10-CM | POA: Diagnosis not present

## 2017-07-21 DIAGNOSIS — Z79899 Other long term (current) drug therapy: Secondary | ICD-10-CM | POA: Diagnosis not present

## 2017-07-22 DIAGNOSIS — C9100 Acute lymphoblastic leukemia not having achieved remission: Secondary | ICD-10-CM | POA: Diagnosis not present

## 2017-07-22 DIAGNOSIS — Z5111 Encounter for antineoplastic chemotherapy: Secondary | ICD-10-CM | POA: Diagnosis not present

## 2017-07-23 DIAGNOSIS — Z5111 Encounter for antineoplastic chemotherapy: Secondary | ICD-10-CM | POA: Diagnosis not present

## 2017-07-23 DIAGNOSIS — C9100 Acute lymphoblastic leukemia not having achieved remission: Secondary | ICD-10-CM | POA: Diagnosis not present

## 2017-07-27 DIAGNOSIS — E039 Hypothyroidism, unspecified: Secondary | ICD-10-CM | POA: Diagnosis not present

## 2017-07-27 DIAGNOSIS — C9100 Acute lymphoblastic leukemia not having achieved remission: Secondary | ICD-10-CM | POA: Diagnosis not present

## 2017-07-27 DIAGNOSIS — E785 Hyperlipidemia, unspecified: Secondary | ICD-10-CM | POA: Diagnosis not present

## 2017-07-27 DIAGNOSIS — C9101 Acute lymphoblastic leukemia, in remission: Secondary | ICD-10-CM | POA: Diagnosis not present

## 2017-07-31 DIAGNOSIS — C9100 Acute lymphoblastic leukemia not having achieved remission: Secondary | ICD-10-CM | POA: Diagnosis not present

## 2017-07-31 DIAGNOSIS — C9101 Acute lymphoblastic leukemia, in remission: Secondary | ICD-10-CM | POA: Diagnosis not present

## 2017-08-03 DIAGNOSIS — C9101 Acute lymphoblastic leukemia, in remission: Secondary | ICD-10-CM | POA: Diagnosis not present

## 2017-08-03 DIAGNOSIS — E785 Hyperlipidemia, unspecified: Secondary | ICD-10-CM | POA: Diagnosis not present

## 2017-08-03 DIAGNOSIS — E039 Hypothyroidism, unspecified: Secondary | ICD-10-CM | POA: Diagnosis not present

## 2017-08-07 DIAGNOSIS — C9100 Acute lymphoblastic leukemia not having achieved remission: Secondary | ICD-10-CM | POA: Diagnosis not present

## 2017-08-10 DIAGNOSIS — C9100 Acute lymphoblastic leukemia not having achieved remission: Secondary | ICD-10-CM | POA: Diagnosis not present

## 2017-08-10 DIAGNOSIS — E039 Hypothyroidism, unspecified: Secondary | ICD-10-CM | POA: Diagnosis not present

## 2017-08-10 DIAGNOSIS — C9101 Acute lymphoblastic leukemia, in remission: Secondary | ICD-10-CM | POA: Diagnosis not present

## 2017-08-10 DIAGNOSIS — E785 Hyperlipidemia, unspecified: Secondary | ICD-10-CM | POA: Diagnosis not present

## 2017-08-14 DIAGNOSIS — C9100 Acute lymphoblastic leukemia not having achieved remission: Secondary | ICD-10-CM | POA: Diagnosis not present

## 2017-08-14 DIAGNOSIS — Z79899 Other long term (current) drug therapy: Secondary | ICD-10-CM | POA: Diagnosis not present

## 2017-08-21 DIAGNOSIS — C9100 Acute lymphoblastic leukemia not having achieved remission: Secondary | ICD-10-CM | POA: Diagnosis not present

## 2017-08-21 DIAGNOSIS — Z79899 Other long term (current) drug therapy: Secondary | ICD-10-CM | POA: Diagnosis not present

## 2017-08-21 DIAGNOSIS — C9101 Acute lymphoblastic leukemia, in remission: Secondary | ICD-10-CM | POA: Diagnosis not present

## 2017-08-24 DIAGNOSIS — C9101 Acute lymphoblastic leukemia, in remission: Secondary | ICD-10-CM | POA: Diagnosis not present

## 2017-08-24 DIAGNOSIS — D688 Other specified coagulation defects: Secondary | ICD-10-CM | POA: Diagnosis not present

## 2017-08-24 DIAGNOSIS — C8370 Burkitt lymphoma, unspecified site: Secondary | ICD-10-CM | POA: Diagnosis not present

## 2017-08-24 DIAGNOSIS — C9100 Acute lymphoblastic leukemia not having achieved remission: Secondary | ICD-10-CM | POA: Diagnosis not present

## 2017-08-24 DIAGNOSIS — E785 Hyperlipidemia, unspecified: Secondary | ICD-10-CM | POA: Diagnosis not present

## 2017-08-24 DIAGNOSIS — D61818 Other pancytopenia: Secondary | ICD-10-CM | POA: Diagnosis not present

## 2017-08-28 DIAGNOSIS — C9101 Acute lymphoblastic leukemia, in remission: Secondary | ICD-10-CM | POA: Diagnosis not present

## 2017-09-03 DIAGNOSIS — C9101 Acute lymphoblastic leukemia, in remission: Secondary | ICD-10-CM | POA: Diagnosis not present

## 2017-09-07 DIAGNOSIS — C9101 Acute lymphoblastic leukemia, in remission: Secondary | ICD-10-CM | POA: Diagnosis not present

## 2017-09-14 DIAGNOSIS — R251 Tremor, unspecified: Secondary | ICD-10-CM | POA: Diagnosis not present

## 2017-09-14 DIAGNOSIS — D61810 Antineoplastic chemotherapy induced pancytopenia: Secondary | ICD-10-CM | POA: Diagnosis not present

## 2017-09-14 DIAGNOSIS — E039 Hypothyroidism, unspecified: Secondary | ICD-10-CM | POA: Diagnosis not present

## 2017-09-14 DIAGNOSIS — C9101 Acute lymphoblastic leukemia, in remission: Secondary | ICD-10-CM | POA: Diagnosis not present

## 2017-09-14 DIAGNOSIS — R1013 Epigastric pain: Secondary | ICD-10-CM | POA: Diagnosis not present

## 2017-09-14 DIAGNOSIS — C9100 Acute lymphoblastic leukemia not having achieved remission: Secondary | ICD-10-CM | POA: Diagnosis not present

## 2017-09-14 DIAGNOSIS — Z5111 Encounter for antineoplastic chemotherapy: Secondary | ICD-10-CM | POA: Diagnosis not present

## 2017-09-21 DIAGNOSIS — R740 Nonspecific elevation of levels of transaminase and lactic acid dehydrogenase [LDH]: Secondary | ICD-10-CM | POA: Diagnosis not present

## 2017-09-21 DIAGNOSIS — D61818 Other pancytopenia: Secondary | ICD-10-CM | POA: Diagnosis not present

## 2017-09-21 DIAGNOSIS — D61810 Antineoplastic chemotherapy induced pancytopenia: Secondary | ICD-10-CM | POA: Diagnosis not present

## 2017-09-21 DIAGNOSIS — Z5111 Encounter for antineoplastic chemotherapy: Secondary | ICD-10-CM | POA: Diagnosis not present

## 2017-09-21 DIAGNOSIS — C9101 Acute lymphoblastic leukemia, in remission: Secondary | ICD-10-CM | POA: Diagnosis not present

## 2017-09-21 DIAGNOSIS — I071 Rheumatic tricuspid insufficiency: Secondary | ICD-10-CM | POA: Diagnosis not present

## 2017-09-21 DIAGNOSIS — Z006 Encounter for examination for normal comparison and control in clinical research program: Secondary | ICD-10-CM | POA: Diagnosis not present

## 2017-09-29 DIAGNOSIS — C9101 Acute lymphoblastic leukemia, in remission: Secondary | ICD-10-CM | POA: Diagnosis not present

## 2017-10-02 DIAGNOSIS — C9101 Acute lymphoblastic leukemia, in remission: Secondary | ICD-10-CM | POA: Diagnosis not present

## 2017-10-04 IMAGING — MG MM DIGITAL SCREENING BILAT
5 series · 5 of 5 positions shown · non-contrast
Comparison: None.

CLINICAL DATA: Screening.

EXAM:
DIGITAL SCREENING BILATERAL MAMMOGRAM WITH CAD

[L CC]
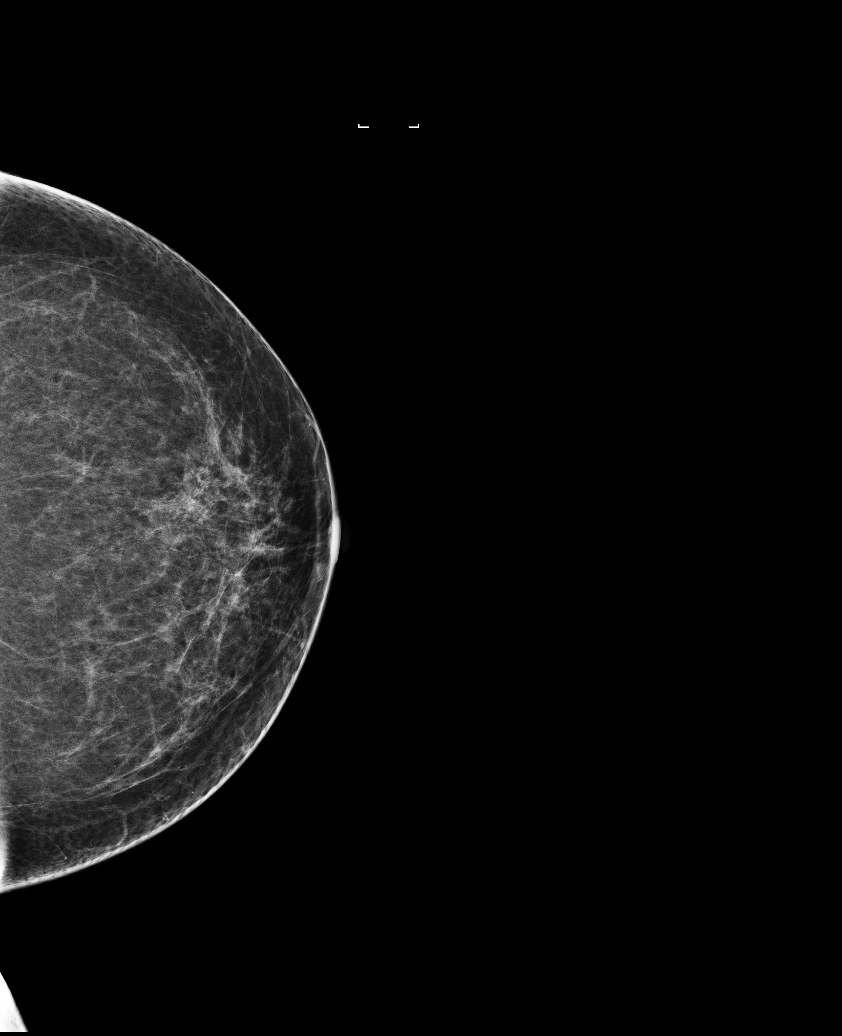

[R CC]
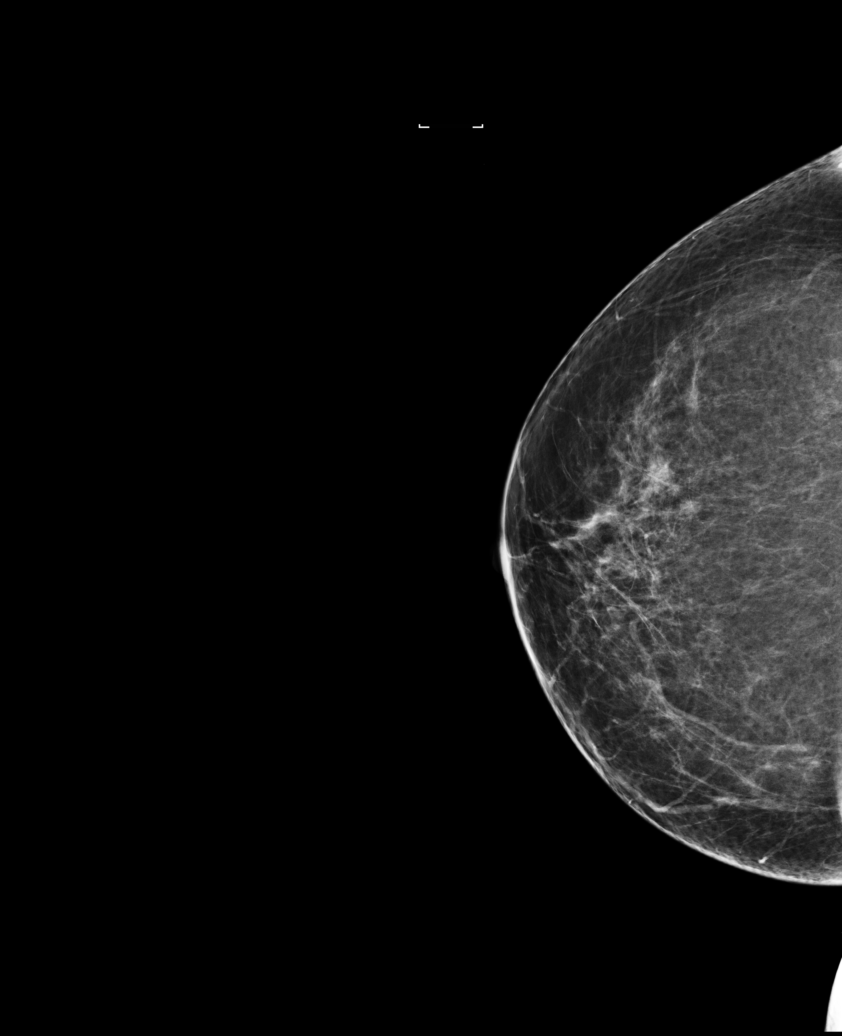

[L MLO]
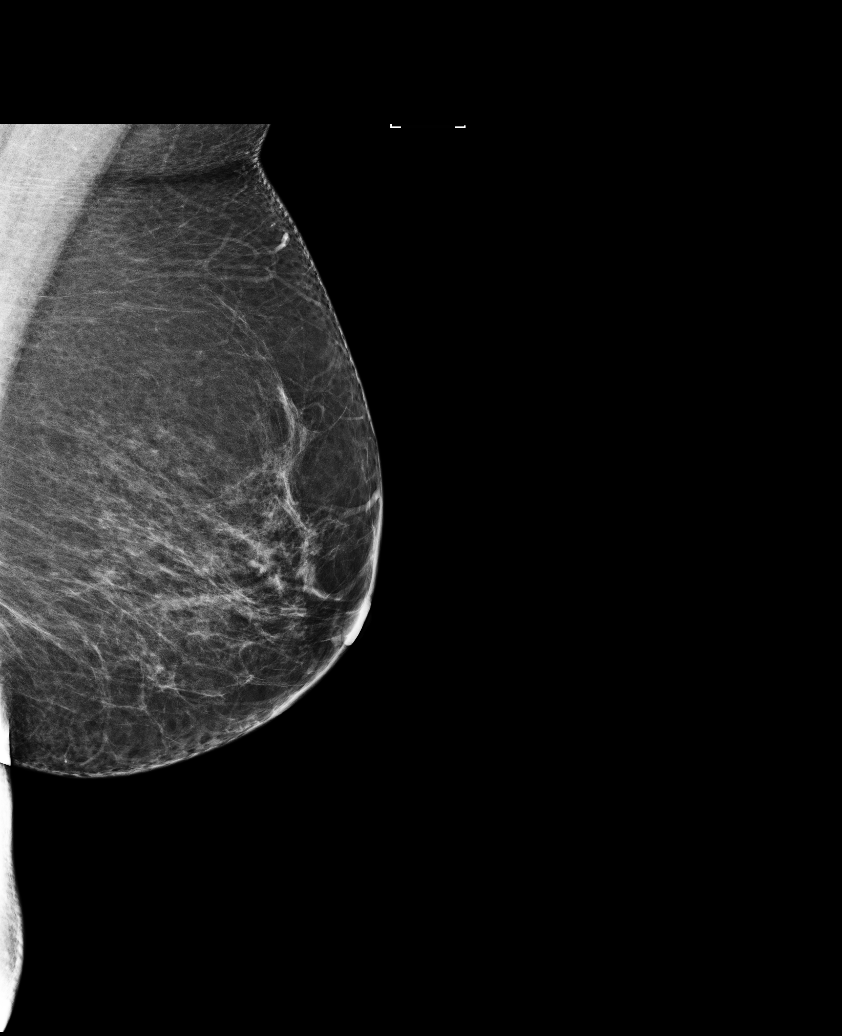

[R MLO (1 of 2)]
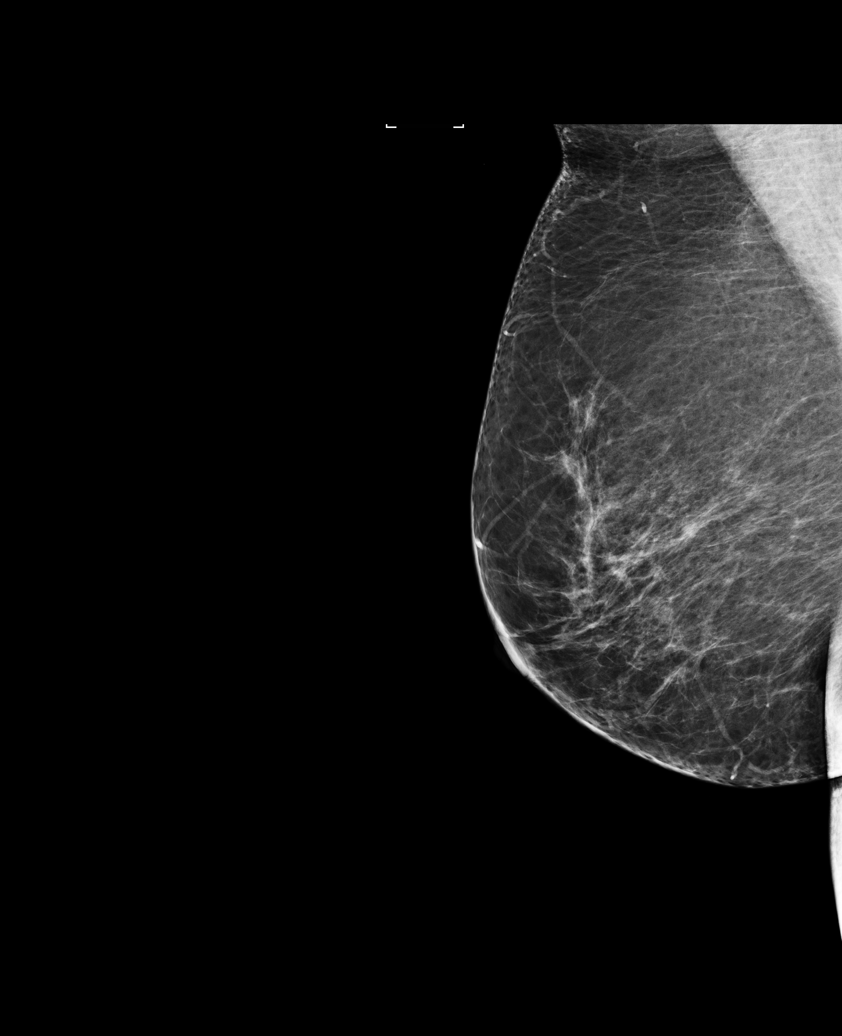

[R MLO (2 of 2)]
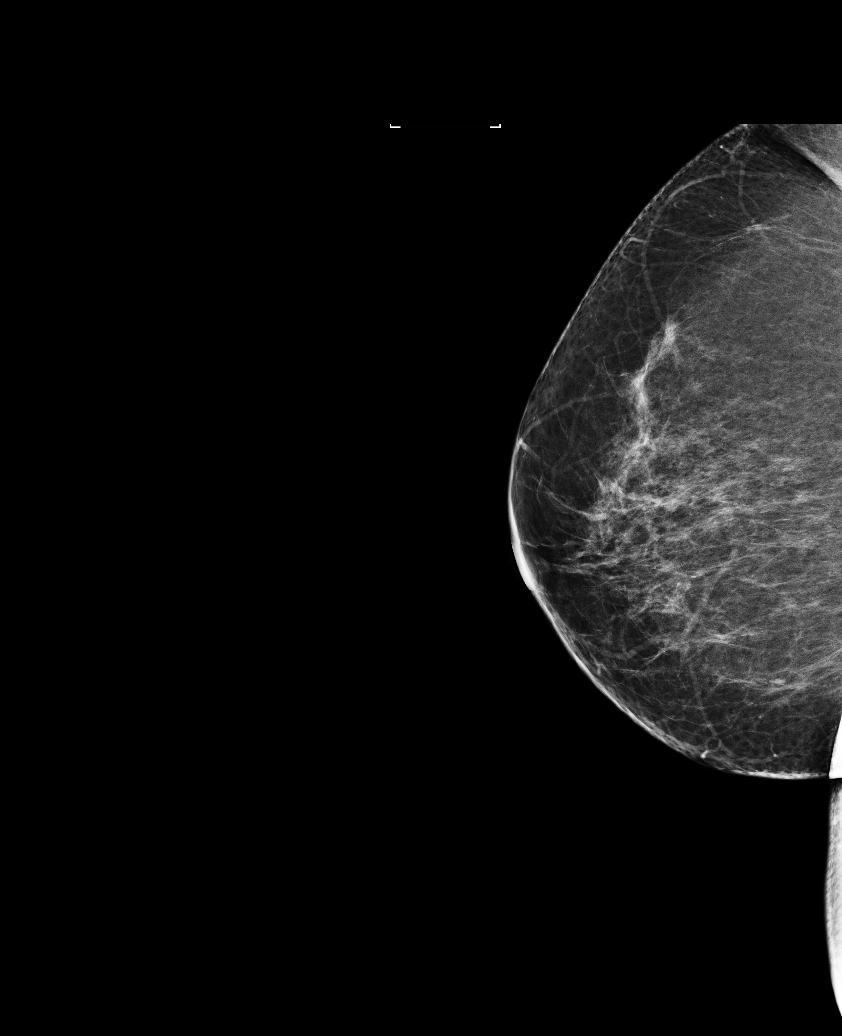

[5 of 5 positions shown; findings below may reference images not displayed]

ACR Breast Density Category b: There are scattered areas of
fibroglandular density.
FINDINGS: There are no findings suspicious for malignancy. Images were
processed with CAD.
IMPRESSION: No mammographic evidence of malignancy. A result letter of this
screening mammogram will be mailed directly to the patient.

RECOMMENDATION:
Screening mammogram in one year. (Code:[id number])

BI-RADS CATEGORY  1: Negative.

## 2017-10-05 DIAGNOSIS — C9101 Acute lymphoblastic leukemia, in remission: Secondary | ICD-10-CM | POA: Diagnosis not present

## 2017-10-07 DIAGNOSIS — D649 Anemia, unspecified: Secondary | ICD-10-CM | POA: Diagnosis not present

## 2017-10-07 DIAGNOSIS — D696 Thrombocytopenia, unspecified: Secondary | ICD-10-CM | POA: Diagnosis not present

## 2017-10-07 DIAGNOSIS — C9101 Acute lymphoblastic leukemia, in remission: Secondary | ICD-10-CM | POA: Diagnosis not present

## 2017-10-07 DIAGNOSIS — C9100 Acute lymphoblastic leukemia not having achieved remission: Secondary | ICD-10-CM | POA: Diagnosis not present

## 2017-10-19 DIAGNOSIS — D61810 Antineoplastic chemotherapy induced pancytopenia: Secondary | ICD-10-CM | POA: Diagnosis not present

## 2017-10-19 DIAGNOSIS — C9101 Acute lymphoblastic leukemia, in remission: Secondary | ICD-10-CM | POA: Diagnosis not present

## 2017-10-19 DIAGNOSIS — Z5111 Encounter for antineoplastic chemotherapy: Secondary | ICD-10-CM | POA: Diagnosis not present

## 2017-10-19 DIAGNOSIS — R251 Tremor, unspecified: Secondary | ICD-10-CM | POA: Diagnosis not present

## 2017-10-19 DIAGNOSIS — E039 Hypothyroidism, unspecified: Secondary | ICD-10-CM | POA: Diagnosis not present

## 2017-10-19 DIAGNOSIS — C9100 Acute lymphoblastic leukemia not having achieved remission: Secondary | ICD-10-CM | POA: Diagnosis not present

## 2017-10-26 DIAGNOSIS — D688 Other specified coagulation defects: Secondary | ICD-10-CM | POA: Diagnosis not present

## 2017-10-26 DIAGNOSIS — R251 Tremor, unspecified: Secondary | ICD-10-CM | POA: Diagnosis not present

## 2017-10-26 DIAGNOSIS — C9101 Acute lymphoblastic leukemia, in remission: Secondary | ICD-10-CM | POA: Diagnosis not present

## 2017-11-02 DIAGNOSIS — R110 Nausea: Secondary | ICD-10-CM | POA: Diagnosis not present

## 2017-11-02 DIAGNOSIS — R251 Tremor, unspecified: Secondary | ICD-10-CM | POA: Diagnosis not present

## 2017-11-02 DIAGNOSIS — C9100 Acute lymphoblastic leukemia not having achieved remission: Secondary | ICD-10-CM | POA: Diagnosis not present

## 2017-11-02 DIAGNOSIS — C9101 Acute lymphoblastic leukemia, in remission: Secondary | ICD-10-CM | POA: Diagnosis not present

## 2017-11-09 DIAGNOSIS — R251 Tremor, unspecified: Secondary | ICD-10-CM | POA: Diagnosis not present

## 2017-11-09 DIAGNOSIS — C9101 Acute lymphoblastic leukemia, in remission: Secondary | ICD-10-CM | POA: Diagnosis not present

## 2017-11-09 DIAGNOSIS — R197 Diarrhea, unspecified: Secondary | ICD-10-CM | POA: Diagnosis not present

## 2017-11-16 DIAGNOSIS — C9101 Acute lymphoblastic leukemia, in remission: Secondary | ICD-10-CM | POA: Diagnosis not present

## 2017-11-23 ENCOUNTER — Other Ambulatory Visit: Payer: Self-pay | Admitting: Family Medicine

## 2017-11-23 DIAGNOSIS — Z1231 Encounter for screening mammogram for malignant neoplasm of breast: Secondary | ICD-10-CM

## 2017-11-30 DIAGNOSIS — C9101 Acute lymphoblastic leukemia, in remission: Secondary | ICD-10-CM | POA: Diagnosis not present

## 2017-11-30 DIAGNOSIS — Z006 Encounter for examination for normal comparison and control in clinical research program: Secondary | ICD-10-CM | POA: Diagnosis not present

## 2017-11-30 DIAGNOSIS — Z9221 Personal history of antineoplastic chemotherapy: Secondary | ICD-10-CM | POA: Diagnosis not present

## 2017-11-30 DIAGNOSIS — E039 Hypothyroidism, unspecified: Secondary | ICD-10-CM | POA: Diagnosis not present

## 2017-12-03 DIAGNOSIS — Z7189 Other specified counseling: Secondary | ICD-10-CM | POA: Diagnosis not present

## 2017-12-03 DIAGNOSIS — Z01818 Encounter for other preprocedural examination: Secondary | ICD-10-CM | POA: Diagnosis not present

## 2017-12-03 DIAGNOSIS — C9101 Acute lymphoblastic leukemia, in remission: Secondary | ICD-10-CM | POA: Diagnosis not present

## 2017-12-07 DIAGNOSIS — C9101 Acute lymphoblastic leukemia, in remission: Secondary | ICD-10-CM | POA: Diagnosis not present

## 2017-12-07 DIAGNOSIS — D688 Other specified coagulation defects: Secondary | ICD-10-CM | POA: Diagnosis not present

## 2017-12-07 DIAGNOSIS — E039 Hypothyroidism, unspecified: Secondary | ICD-10-CM | POA: Diagnosis not present

## 2017-12-07 DIAGNOSIS — R110 Nausea: Secondary | ICD-10-CM | POA: Diagnosis not present

## 2017-12-07 DIAGNOSIS — Z006 Encounter for examination for normal comparison and control in clinical research program: Secondary | ICD-10-CM | POA: Diagnosis not present

## 2017-12-10 ENCOUNTER — Ambulatory Visit: Payer: 59

## 2017-12-11 ENCOUNTER — Ambulatory Visit: Payer: 59

## 2017-12-14 DIAGNOSIS — Z66 Do not resuscitate: Secondary | ICD-10-CM | POA: Diagnosis not present

## 2017-12-14 DIAGNOSIS — C9100 Acute lymphoblastic leukemia not having achieved remission: Secondary | ICD-10-CM | POA: Diagnosis not present

## 2017-12-14 DIAGNOSIS — C9101 Acute lymphoblastic leukemia, in remission: Secondary | ICD-10-CM | POA: Diagnosis not present

## 2017-12-14 DIAGNOSIS — Z5111 Encounter for antineoplastic chemotherapy: Secondary | ICD-10-CM | POA: Diagnosis not present

## 2017-12-14 DIAGNOSIS — E039 Hypothyroidism, unspecified: Secondary | ICD-10-CM | POA: Diagnosis not present

## 2017-12-14 DIAGNOSIS — R251 Tremor, unspecified: Secondary | ICD-10-CM | POA: Diagnosis not present

## 2017-12-18 DIAGNOSIS — C9100 Acute lymphoblastic leukemia not having achieved remission: Secondary | ICD-10-CM | POA: Diagnosis not present

## 2017-12-18 DIAGNOSIS — C9101 Acute lymphoblastic leukemia, in remission: Secondary | ICD-10-CM | POA: Diagnosis not present

## 2017-12-18 DIAGNOSIS — Z5111 Encounter for antineoplastic chemotherapy: Secondary | ICD-10-CM | POA: Diagnosis not present

## 2017-12-21 DIAGNOSIS — C9101 Acute lymphoblastic leukemia, in remission: Secondary | ICD-10-CM | POA: Diagnosis not present

## 2017-12-21 DIAGNOSIS — R251 Tremor, unspecified: Secondary | ICD-10-CM | POA: Diagnosis not present

## 2017-12-21 DIAGNOSIS — R51 Headache: Secondary | ICD-10-CM | POA: Diagnosis not present

## 2017-12-28 DIAGNOSIS — C9101 Acute lymphoblastic leukemia, in remission: Secondary | ICD-10-CM | POA: Diagnosis not present

## 2017-12-28 DIAGNOSIS — D688 Other specified coagulation defects: Secondary | ICD-10-CM | POA: Diagnosis not present

## 2017-12-28 DIAGNOSIS — R251 Tremor, unspecified: Secondary | ICD-10-CM | POA: Diagnosis not present

## 2017-12-28 DIAGNOSIS — Z5111 Encounter for antineoplastic chemotherapy: Secondary | ICD-10-CM | POA: Diagnosis not present

## 2017-12-28 DIAGNOSIS — E039 Hypothyroidism, unspecified: Secondary | ICD-10-CM | POA: Diagnosis not present

## 2018-01-04 DIAGNOSIS — C9101 Acute lymphoblastic leukemia, in remission: Secondary | ICD-10-CM | POA: Diagnosis not present

## 2018-01-11 DIAGNOSIS — C9101 Acute lymphoblastic leukemia, in remission: Secondary | ICD-10-CM | POA: Diagnosis not present

## 2018-01-11 DIAGNOSIS — D72819 Decreased white blood cell count, unspecified: Secondary | ICD-10-CM | POA: Diagnosis not present

## 2018-01-11 DIAGNOSIS — D649 Anemia, unspecified: Secondary | ICD-10-CM | POA: Diagnosis not present

## 2018-01-14 DIAGNOSIS — E039 Hypothyroidism, unspecified: Secondary | ICD-10-CM | POA: Diagnosis not present

## 2018-01-14 DIAGNOSIS — H6123 Impacted cerumen, bilateral: Secondary | ICD-10-CM | POA: Diagnosis not present

## 2018-01-14 DIAGNOSIS — H9203 Otalgia, bilateral: Secondary | ICD-10-CM | POA: Diagnosis not present

## 2018-01-14 DIAGNOSIS — C9100 Acute lymphoblastic leukemia not having achieved remission: Secondary | ICD-10-CM | POA: Diagnosis not present

## 2018-01-18 DIAGNOSIS — C9101 Acute lymphoblastic leukemia, in remission: Secondary | ICD-10-CM | POA: Diagnosis not present

## 2018-01-22 DIAGNOSIS — C9101 Acute lymphoblastic leukemia, in remission: Secondary | ICD-10-CM | POA: Diagnosis not present

## 2018-01-29 DIAGNOSIS — C9101 Acute lymphoblastic leukemia, in remission: Secondary | ICD-10-CM | POA: Diagnosis not present

## 2018-02-03 DIAGNOSIS — Z006 Encounter for examination for normal comparison and control in clinical research program: Secondary | ICD-10-CM | POA: Diagnosis not present

## 2018-02-03 DIAGNOSIS — D61810 Antineoplastic chemotherapy induced pancytopenia: Secondary | ICD-10-CM | POA: Diagnosis not present

## 2018-02-03 DIAGNOSIS — C9101 Acute lymphoblastic leukemia, in remission: Secondary | ICD-10-CM | POA: Diagnosis not present

## 2018-02-03 DIAGNOSIS — D649 Anemia, unspecified: Secondary | ICD-10-CM | POA: Diagnosis not present

## 2018-02-03 DIAGNOSIS — Z9481 Bone marrow transplant status: Secondary | ICD-10-CM | POA: Diagnosis not present

## 2018-02-03 DIAGNOSIS — Z5111 Encounter for antineoplastic chemotherapy: Secondary | ICD-10-CM | POA: Diagnosis not present

## 2018-04-01 DIAGNOSIS — C9101 Acute lymphoblastic leukemia, in remission: Secondary | ICD-10-CM | POA: Diagnosis not present

## 2018-04-01 DIAGNOSIS — D89810 Acute graft-versus-host disease: Secondary | ICD-10-CM | POA: Diagnosis not present

## 2018-04-01 DIAGNOSIS — R739 Hyperglycemia, unspecified: Secondary | ICD-10-CM | POA: Diagnosis not present

## 2018-05-31 DIAGNOSIS — D89810 Acute graft-versus-host disease: Secondary | ICD-10-CM | POA: Diagnosis not present

## 2018-05-31 DIAGNOSIS — C9101 Acute lymphoblastic leukemia, in remission: Secondary | ICD-10-CM | POA: Diagnosis not present

## 2018-05-31 DIAGNOSIS — Z9481 Bone marrow transplant status: Secondary | ICD-10-CM | POA: Diagnosis not present

## 2018-06-07 DIAGNOSIS — D89810 Acute graft-versus-host disease: Secondary | ICD-10-CM | POA: Diagnosis not present

## 2018-06-07 DIAGNOSIS — C9101 Acute lymphoblastic leukemia, in remission: Secondary | ICD-10-CM | POA: Diagnosis not present

## 2018-06-07 DIAGNOSIS — Z9481 Bone marrow transplant status: Secondary | ICD-10-CM | POA: Diagnosis not present

## 2018-06-15 DIAGNOSIS — Z9481 Bone marrow transplant status: Secondary | ICD-10-CM | POA: Diagnosis not present

## 2018-06-15 DIAGNOSIS — D649 Anemia, unspecified: Secondary | ICD-10-CM | POA: Diagnosis not present

## 2018-06-15 DIAGNOSIS — Z9484 Stem cells transplant status: Secondary | ICD-10-CM | POA: Diagnosis not present

## 2018-06-15 DIAGNOSIS — C9101 Acute lymphoblastic leukemia, in remission: Secondary | ICD-10-CM | POA: Diagnosis not present

## 2018-06-21 DIAGNOSIS — C9101 Acute lymphoblastic leukemia, in remission: Secondary | ICD-10-CM | POA: Diagnosis not present

## 2018-06-21 DIAGNOSIS — D649 Anemia, unspecified: Secondary | ICD-10-CM | POA: Diagnosis not present

## 2018-06-21 DIAGNOSIS — Z9481 Bone marrow transplant status: Secondary | ICD-10-CM | POA: Diagnosis not present

## 2018-06-21 DIAGNOSIS — D89810 Acute graft-versus-host disease: Secondary | ICD-10-CM | POA: Diagnosis not present

## 2018-06-28 DIAGNOSIS — D649 Anemia, unspecified: Secondary | ICD-10-CM | POA: Diagnosis not present

## 2018-06-28 DIAGNOSIS — C9101 Acute lymphoblastic leukemia, in remission: Secondary | ICD-10-CM | POA: Diagnosis not present

## 2018-06-28 DIAGNOSIS — Z9481 Bone marrow transplant status: Secondary | ICD-10-CM | POA: Diagnosis not present

## 2018-07-05 DIAGNOSIS — C9101 Acute lymphoblastic leukemia, in remission: Secondary | ICD-10-CM | POA: Diagnosis not present

## 2018-07-05 DIAGNOSIS — D89810 Acute graft-versus-host disease: Secondary | ICD-10-CM | POA: Diagnosis not present

## 2018-07-05 DIAGNOSIS — Z9481 Bone marrow transplant status: Secondary | ICD-10-CM | POA: Diagnosis not present

## 2018-07-05 DIAGNOSIS — D649 Anemia, unspecified: Secondary | ICD-10-CM | POA: Diagnosis not present

## 2018-07-19 DIAGNOSIS — Z9484 Stem cells transplant status: Secondary | ICD-10-CM | POA: Diagnosis not present

## 2018-07-19 DIAGNOSIS — C9101 Acute lymphoblastic leukemia, in remission: Secondary | ICD-10-CM | POA: Diagnosis not present

## 2018-07-19 DIAGNOSIS — Z9481 Bone marrow transplant status: Secondary | ICD-10-CM | POA: Diagnosis not present

## 2018-07-19 DIAGNOSIS — D849 Immunodeficiency, unspecified: Secondary | ICD-10-CM | POA: Diagnosis not present

## 2018-08-02 DIAGNOSIS — Z9221 Personal history of antineoplastic chemotherapy: Secondary | ICD-10-CM | POA: Diagnosis not present

## 2018-08-02 DIAGNOSIS — D89810 Acute graft-versus-host disease: Secondary | ICD-10-CM | POA: Diagnosis not present

## 2018-08-02 DIAGNOSIS — Z9481 Bone marrow transplant status: Secondary | ICD-10-CM | POA: Diagnosis not present

## 2018-08-02 DIAGNOSIS — Z6829 Body mass index (BMI) 29.0-29.9, adult: Secondary | ICD-10-CM | POA: Diagnosis not present

## 2018-08-02 DIAGNOSIS — E559 Vitamin D deficiency, unspecified: Secondary | ICD-10-CM | POA: Diagnosis not present

## 2018-08-02 DIAGNOSIS — E039 Hypothyroidism, unspecified: Secondary | ICD-10-CM | POA: Diagnosis not present

## 2018-08-02 DIAGNOSIS — Z13220 Encounter for screening for lipoid disorders: Secondary | ICD-10-CM | POA: Diagnosis not present

## 2018-08-02 DIAGNOSIS — C9101 Acute lymphoblastic leukemia, in remission: Secondary | ICD-10-CM | POA: Diagnosis not present

## 2018-08-02 DIAGNOSIS — Z9484 Stem cells transplant status: Secondary | ICD-10-CM | POA: Diagnosis not present

## 2018-08-06 DIAGNOSIS — H11823 Conjunctivochalasis, bilateral: Secondary | ICD-10-CM | POA: Diagnosis not present

## 2018-08-06 DIAGNOSIS — H04123 Dry eye syndrome of bilateral lacrimal glands: Secondary | ICD-10-CM | POA: Diagnosis not present

## 2018-08-06 DIAGNOSIS — D89813 Graft-versus-host disease, unspecified: Secondary | ICD-10-CM | POA: Diagnosis not present

## 2018-08-16 DIAGNOSIS — Z9481 Bone marrow transplant status: Secondary | ICD-10-CM | POA: Diagnosis not present

## 2018-08-16 DIAGNOSIS — C9101 Acute lymphoblastic leukemia, in remission: Secondary | ICD-10-CM | POA: Diagnosis not present

## 2018-08-16 DIAGNOSIS — D89810 Acute graft-versus-host disease: Secondary | ICD-10-CM | POA: Diagnosis not present

## 2018-08-18 DIAGNOSIS — Z9481 Bone marrow transplant status: Secondary | ICD-10-CM | POA: Diagnosis not present

## 2018-08-18 DIAGNOSIS — C9101 Acute lymphoblastic leukemia, in remission: Secondary | ICD-10-CM | POA: Diagnosis not present

## 2018-08-30 DIAGNOSIS — Z9481 Bone marrow transplant status: Secondary | ICD-10-CM | POA: Diagnosis not present

## 2018-08-30 DIAGNOSIS — R05 Cough: Secondary | ICD-10-CM | POA: Diagnosis not present

## 2018-08-30 DIAGNOSIS — D89810 Acute graft-versus-host disease: Secondary | ICD-10-CM | POA: Diagnosis not present

## 2018-09-20 DIAGNOSIS — J069 Acute upper respiratory infection, unspecified: Secondary | ICD-10-CM | POA: Diagnosis not present

## 2018-09-20 DIAGNOSIS — C9101 Acute lymphoblastic leukemia, in remission: Secondary | ICD-10-CM | POA: Diagnosis not present

## 2018-09-20 DIAGNOSIS — Z9481 Bone marrow transplant status: Secondary | ICD-10-CM | POA: Diagnosis not present

## 2018-09-20 DIAGNOSIS — D89810 Acute graft-versus-host disease: Secondary | ICD-10-CM | POA: Diagnosis not present

## 2018-09-20 DIAGNOSIS — D649 Anemia, unspecified: Secondary | ICD-10-CM | POA: Diagnosis not present

## 2018-09-21 DIAGNOSIS — R918 Other nonspecific abnormal finding of lung field: Secondary | ICD-10-CM | POA: Diagnosis not present

## 2018-09-21 DIAGNOSIS — Z9481 Bone marrow transplant status: Secondary | ICD-10-CM | POA: Diagnosis not present

## 2018-09-21 DIAGNOSIS — D89810 Acute graft-versus-host disease: Secondary | ICD-10-CM | POA: Diagnosis not present

## 2018-10-04 DIAGNOSIS — Z9481 Bone marrow transplant status: Secondary | ICD-10-CM | POA: Diagnosis not present

## 2018-10-04 DIAGNOSIS — Z23 Encounter for immunization: Secondary | ICD-10-CM | POA: Diagnosis not present

## 2018-10-04 DIAGNOSIS — C9101 Acute lymphoblastic leukemia, in remission: Secondary | ICD-10-CM | POA: Diagnosis not present

## 2018-10-04 DIAGNOSIS — Z9484 Stem cells transplant status: Secondary | ICD-10-CM | POA: Diagnosis not present

## 2018-10-25 DIAGNOSIS — Z9481 Bone marrow transplant status: Secondary | ICD-10-CM | POA: Diagnosis not present

## 2018-10-25 DIAGNOSIS — C9101 Acute lymphoblastic leukemia, in remission: Secondary | ICD-10-CM | POA: Diagnosis not present

## 2018-10-25 DIAGNOSIS — D89810 Acute graft-versus-host disease: Secondary | ICD-10-CM | POA: Diagnosis not present

## 2018-11-10 ENCOUNTER — Other Ambulatory Visit: Payer: Self-pay | Admitting: Family Medicine

## 2018-11-10 ENCOUNTER — Other Ambulatory Visit (HOSPITAL_COMMUNITY)
Admission: RE | Admit: 2018-11-10 | Discharge: 2018-11-10 | Disposition: A | Discharge status: Home / Self Care | Payer: 59 | Source: Ambulatory Visit | Attending: Family Medicine | Admitting: Family Medicine

## 2018-11-10 DIAGNOSIS — Z01411 Encounter for gynecological examination (general) (routine) with abnormal findings: Secondary | ICD-10-CM | POA: Diagnosis not present

## 2018-11-10 DIAGNOSIS — E039 Hypothyroidism, unspecified: Secondary | ICD-10-CM | POA: Diagnosis not present

## 2018-11-10 DIAGNOSIS — Z0001 Encounter for general adult medical examination with abnormal findings: Secondary | ICD-10-CM | POA: Diagnosis not present

## 2018-11-10 DIAGNOSIS — Z124 Encounter for screening for malignant neoplasm of cervix: Secondary | ICD-10-CM | POA: Diagnosis not present

## 2018-11-10 DIAGNOSIS — Z13220 Encounter for screening for lipoid disorders: Secondary | ICD-10-CM | POA: Diagnosis not present

## 2018-11-10 DIAGNOSIS — E559 Vitamin D deficiency, unspecified: Secondary | ICD-10-CM | POA: Diagnosis not present

## 2018-11-10 DIAGNOSIS — Z9481 Bone marrow transplant status: Secondary | ICD-10-CM | POA: Diagnosis not present

## 2018-11-15 DIAGNOSIS — C9101 Acute lymphoblastic leukemia, in remission: Secondary | ICD-10-CM | POA: Diagnosis not present

## 2018-11-15 DIAGNOSIS — D89810 Acute graft-versus-host disease: Secondary | ICD-10-CM | POA: Diagnosis not present

## 2018-11-15 DIAGNOSIS — Z9484 Stem cells transplant status: Secondary | ICD-10-CM | POA: Diagnosis not present

## 2018-11-15 DIAGNOSIS — Z79899 Other long term (current) drug therapy: Secondary | ICD-10-CM | POA: Diagnosis not present

## 2018-11-15 DIAGNOSIS — Z9481 Bone marrow transplant status: Secondary | ICD-10-CM | POA: Diagnosis not present

## 2018-11-15 LAB — CYTOLOGY - PAP: HPV (WINDOPATH): NOT DETECTED

## 2018-11-16 ENCOUNTER — Other Ambulatory Visit: Payer: Self-pay | Admitting: Family Medicine

## 2018-11-16 DIAGNOSIS — M8580 Other specified disorders of bone density and structure, unspecified site: Secondary | ICD-10-CM

## 2018-11-18 ENCOUNTER — Other Ambulatory Visit: Payer: Self-pay

## 2018-11-18 ENCOUNTER — Other Ambulatory Visit: Payer: Self-pay | Admitting: Family Medicine

## 2018-11-18 ENCOUNTER — Ambulatory Visit
Admission: RE | Admit: 2018-11-18 | Discharge: 2018-11-18 | Disposition: A | Discharge status: Home / Self Care | Payer: 59 | Source: Ambulatory Visit | Attending: Family Medicine | Admitting: Family Medicine

## 2018-11-18 DIAGNOSIS — Z1231 Encounter for screening mammogram for malignant neoplasm of breast: Secondary | ICD-10-CM | POA: Diagnosis not present

## 2018-12-01 ENCOUNTER — Other Ambulatory Visit: Payer: Self-pay | Admitting: Obstetrics and Gynecology

## 2018-12-01 DIAGNOSIS — R87612 Low grade squamous intraepithelial lesion on cytologic smear of cervix (LGSIL): Secondary | ICD-10-CM | POA: Diagnosis not present

## 2018-12-01 DIAGNOSIS — N888 Other specified noninflammatory disorders of cervix uteri: Secondary | ICD-10-CM | POA: Diagnosis not present

## 2018-12-01 DIAGNOSIS — Z3202 Encounter for pregnancy test, result negative: Secondary | ICD-10-CM | POA: Diagnosis not present

## 2018-12-13 DIAGNOSIS — Z9481 Bone marrow transplant status: Secondary | ICD-10-CM | POA: Diagnosis not present

## 2018-12-13 DIAGNOSIS — C9101 Acute lymphoblastic leukemia, in remission: Secondary | ICD-10-CM | POA: Diagnosis not present

## 2018-12-13 DIAGNOSIS — Z9484 Stem cells transplant status: Secondary | ICD-10-CM | POA: Diagnosis not present

## 2019-01-10 DIAGNOSIS — C9101 Acute lymphoblastic leukemia, in remission: Secondary | ICD-10-CM | POA: Diagnosis not present

## 2019-01-10 DIAGNOSIS — Z9481 Bone marrow transplant status: Secondary | ICD-10-CM | POA: Diagnosis not present

## 2019-01-10 DIAGNOSIS — D649 Anemia, unspecified: Secondary | ICD-10-CM | POA: Diagnosis not present

## 2019-01-10 DIAGNOSIS — R945 Abnormal results of liver function studies: Secondary | ICD-10-CM | POA: Diagnosis not present

## 2019-01-18 ENCOUNTER — Other Ambulatory Visit: Payer: 59

## 2019-01-28 DIAGNOSIS — E7800 Pure hypercholesterolemia, unspecified: Secondary | ICD-10-CM | POA: Diagnosis not present

## 2019-04-15 ENCOUNTER — Other Ambulatory Visit: Payer: Self-pay

## 2019-04-15 ENCOUNTER — Ambulatory Visit
Admission: RE | Admit: 2019-04-15 | Discharge: 2019-04-15 | Disposition: A | Discharge status: Home / Self Care | Payer: 59 | Source: Ambulatory Visit | Attending: Family Medicine | Admitting: Family Medicine

## 2019-04-15 DIAGNOSIS — M8580 Other specified disorders of bone density and structure, unspecified site: Secondary | ICD-10-CM

## 2019-04-28 ENCOUNTER — Other Ambulatory Visit: Payer: Self-pay

## 2019-04-28 DIAGNOSIS — Z20822 Contact with and (suspected) exposure to covid-19: Secondary | ICD-10-CM

## 2019-04-29 LAB — NOVEL CORONAVIRUS, NAA: SARS-CoV-2, NAA: NOT DETECTED

## 2019-05-04 ENCOUNTER — Other Ambulatory Visit: Payer: Self-pay | Admitting: *Deleted

## 2019-05-04 DIAGNOSIS — J09X2 Influenza due to identified novel influenza A virus with other respiratory manifestations: Secondary | ICD-10-CM

## 2019-05-05 LAB — NOVEL CORONAVIRUS, NAA: SARS-CoV-2, NAA: NOT DETECTED

## 2019-07-12 ENCOUNTER — Other Ambulatory Visit: Payer: Self-pay

## 2019-07-12 DIAGNOSIS — Z20822 Contact with and (suspected) exposure to covid-19: Secondary | ICD-10-CM

## 2019-07-13 LAB — NOVEL CORONAVIRUS, NAA: SARS-CoV-2, NAA: NOT DETECTED

## 2020-01-26 ENCOUNTER — Other Ambulatory Visit: Payer: Self-pay | Admitting: Obstetrics and Gynecology

## 2020-09-06 ENCOUNTER — Other Ambulatory Visit: Payer: Self-pay | Admitting: Family Medicine

## 2020-09-06 DIAGNOSIS — Z1231 Encounter for screening mammogram for malignant neoplasm of breast: Secondary | ICD-10-CM

## 2020-10-17 ENCOUNTER — Other Ambulatory Visit: Payer: Self-pay

## 2020-10-17 ENCOUNTER — Encounter (HOSPITAL_BASED_OUTPATIENT_CLINIC_OR_DEPARTMENT_OTHER): Payer: Self-pay | Admitting: Obstetrics and Gynecology

## 2020-10-19 ENCOUNTER — Ambulatory Visit
Admission: RE | Admit: 2020-10-19 | Discharge: 2020-10-19 | Disposition: A | Discharge status: Home / Self Care | Payer: 59 | Source: Ambulatory Visit | Attending: Family Medicine | Admitting: Family Medicine

## 2020-10-19 ENCOUNTER — Other Ambulatory Visit: Payer: Self-pay

## 2020-10-19 DIAGNOSIS — Z1231 Encounter for screening mammogram for malignant neoplasm of breast: Secondary | ICD-10-CM

## 2020-10-20 ENCOUNTER — Inpatient Hospital Stay (HOSPITAL_COMMUNITY): Admission: RE | Admit: 2020-10-20 | Payer: 59 | Source: Ambulatory Visit

## 2020-10-22 ENCOUNTER — Other Ambulatory Visit (HOSPITAL_COMMUNITY)
Admission: RE | Admit: 2020-10-22 | Discharge: 2020-10-22 | Disposition: A | Discharge status: Home / Self Care | Payer: 59 | Source: Ambulatory Visit | Attending: Obstetrics and Gynecology | Admitting: Obstetrics and Gynecology

## 2020-10-22 DIAGNOSIS — Z01812 Encounter for preprocedural laboratory examination: Secondary | ICD-10-CM | POA: Insufficient documentation

## 2020-10-22 DIAGNOSIS — Z20822 Contact with and (suspected) exposure to covid-19: Secondary | ICD-10-CM | POA: Insufficient documentation

## 2020-10-23 ENCOUNTER — Encounter (HOSPITAL_BASED_OUTPATIENT_CLINIC_OR_DEPARTMENT_OTHER): Payer: Self-pay | Admitting: Obstetrics and Gynecology

## 2020-10-23 ENCOUNTER — Other Ambulatory Visit: Payer: Self-pay | Admitting: Obstetrics and Gynecology

## 2020-10-23 ENCOUNTER — Encounter (HOSPITAL_BASED_OUTPATIENT_CLINIC_OR_DEPARTMENT_OTHER)
Admission: RE | Admit: 2020-10-23 | Discharge: 2020-10-23 | Disposition: A | Discharge status: Home / Self Care | Payer: 59 | Source: Ambulatory Visit | Attending: Obstetrics and Gynecology | Admitting: Obstetrics and Gynecology

## 2020-10-23 DIAGNOSIS — Z98891 History of uterine scar from previous surgery: Secondary | ICD-10-CM | POA: Diagnosis not present

## 2020-10-23 DIAGNOSIS — N840 Polyp of corpus uteri: Secondary | ICD-10-CM | POA: Diagnosis not present

## 2020-10-23 DIAGNOSIS — C9100 Acute lymphoblastic leukemia not having achieved remission: Secondary | ICD-10-CM | POA: Diagnosis not present

## 2020-10-23 DIAGNOSIS — Z9481 Bone marrow transplant status: Secondary | ICD-10-CM | POA: Diagnosis not present

## 2020-10-23 DIAGNOSIS — Z808 Family history of malignant neoplasm of other organs or systems: Secondary | ICD-10-CM | POA: Diagnosis not present

## 2020-10-23 DIAGNOSIS — E7800 Pure hypercholesterolemia, unspecified: Secondary | ICD-10-CM | POA: Diagnosis not present

## 2020-10-23 DIAGNOSIS — Z8052 Family history of malignant neoplasm of bladder: Secondary | ICD-10-CM | POA: Diagnosis not present

## 2020-10-23 DIAGNOSIS — Z79899 Other long term (current) drug therapy: Secondary | ICD-10-CM | POA: Diagnosis not present

## 2020-10-23 DIAGNOSIS — N950 Postmenopausal bleeding: Secondary | ICD-10-CM | POA: Diagnosis present

## 2020-10-23 DIAGNOSIS — Z803 Family history of malignant neoplasm of breast: Secondary | ICD-10-CM | POA: Diagnosis not present

## 2020-10-23 DIAGNOSIS — Z79890 Hormone replacement therapy (postmenopausal): Secondary | ICD-10-CM | POA: Diagnosis not present

## 2020-10-23 LAB — TYPE AND SCREEN
ABO/RH(D): AB POS
Antibody Screen: NEGATIVE

## 2020-10-23 LAB — SARS CORONAVIRUS 2 (TAT 6-24 HRS): SARS Coronavirus 2: NEGATIVE

## 2020-10-23 NOTE — Progress Notes (Signed)
Reminded pt to come in for labwork, states will come in today.

## 2020-10-24 ENCOUNTER — Other Ambulatory Visit: Payer: Self-pay

## 2020-10-24 ENCOUNTER — Ambulatory Visit (HOSPITAL_BASED_OUTPATIENT_CLINIC_OR_DEPARTMENT_OTHER): Payer: 59 | Admitting: Anesthesiology

## 2020-10-24 ENCOUNTER — Encounter (HOSPITAL_BASED_OUTPATIENT_CLINIC_OR_DEPARTMENT_OTHER): Payer: Self-pay | Admitting: Obstetrics and Gynecology

## 2020-10-24 ENCOUNTER — Other Ambulatory Visit: Payer: Self-pay | Admitting: Obstetrics and Gynecology

## 2020-10-24 ENCOUNTER — Encounter (HOSPITAL_BASED_OUTPATIENT_CLINIC_OR_DEPARTMENT_OTHER)
Admission: RE | Disposition: A | Discharge status: Home / Self Care | Payer: Self-pay | Source: Home / Self Care | Attending: Obstetrics and Gynecology

## 2020-10-24 ENCOUNTER — Ambulatory Visit (HOSPITAL_BASED_OUTPATIENT_CLINIC_OR_DEPARTMENT_OTHER)
Admission: RE | Admit: 2020-10-24 | Discharge: 2020-10-24 | Disposition: A | Discharge status: Home / Self Care | Payer: 59 | Attending: Obstetrics and Gynecology | Admitting: Obstetrics and Gynecology

## 2020-10-24 DIAGNOSIS — C9100 Acute lymphoblastic leukemia not having achieved remission: Secondary | ICD-10-CM | POA: Insufficient documentation

## 2020-10-24 DIAGNOSIS — E7800 Pure hypercholesterolemia, unspecified: Secondary | ICD-10-CM | POA: Insufficient documentation

## 2020-10-24 DIAGNOSIS — Z9481 Bone marrow transplant status: Secondary | ICD-10-CM | POA: Insufficient documentation

## 2020-10-24 DIAGNOSIS — Z98891 History of uterine scar from previous surgery: Secondary | ICD-10-CM | POA: Insufficient documentation

## 2020-10-24 DIAGNOSIS — Z8052 Family history of malignant neoplasm of bladder: Secondary | ICD-10-CM | POA: Insufficient documentation

## 2020-10-24 DIAGNOSIS — Z808 Family history of malignant neoplasm of other organs or systems: Secondary | ICD-10-CM | POA: Insufficient documentation

## 2020-10-24 DIAGNOSIS — Z79899 Other long term (current) drug therapy: Secondary | ICD-10-CM | POA: Insufficient documentation

## 2020-10-24 DIAGNOSIS — N950 Postmenopausal bleeding: Secondary | ICD-10-CM | POA: Insufficient documentation

## 2020-10-24 DIAGNOSIS — Z79890 Hormone replacement therapy (postmenopausal): Secondary | ICD-10-CM | POA: Insufficient documentation

## 2020-10-24 DIAGNOSIS — N840 Polyp of corpus uteri: Secondary | ICD-10-CM | POA: Insufficient documentation

## 2020-10-24 DIAGNOSIS — Z803 Family history of malignant neoplasm of breast: Secondary | ICD-10-CM | POA: Insufficient documentation

## 2020-10-24 HISTORY — PX: DILATATION & CURETTAGE/HYSTEROSCOPY WITH MYOSURE: SHX6511

## 2020-10-24 SURGERY — DILATATION & CURETTAGE/HYSTEROSCOPY WITH MYOSURE
Anesthesia: General | Site: "Vagina "

## 2020-10-24 MED ORDER — PROPOFOL 10 MG/ML IV BOLUS
INTRAVENOUS | Status: DC | PRN
  Administered 2020-10-24: 150 mg via INTRAVENOUS

## 2020-10-24 MED ORDER — PHENYLEPHRINE 40 MCG/ML (10ML) SYRINGE FOR IV PUSH (FOR BLOOD PRESSURE SUPPORT)
PREFILLED_SYRINGE | INTRAVENOUS | Status: DC | PRN
  Administered 2020-10-24: 120 ug via INTRAVENOUS
  Administered 2020-10-24 (×2): 80 ug via INTRAVENOUS

## 2020-10-24 MED ORDER — LIDOCAINE 2% (20 MG/ML) 5 ML SYRINGE
INTRAMUSCULAR | Status: DC | PRN
  Administered 2020-10-24: 60 mg via INTRAVENOUS

## 2020-10-24 MED ORDER — IBUPROFEN 800 MG PO TABS: 800.0000 mg | ORAL_TABLET | Freq: Three times a day (TID) | ORAL | 0 refills | Status: AC | PRN

## 2020-10-24 MED ORDER — LACTATED RINGERS IV SOLN: INTRAVENOUS | Status: DC

## 2020-10-24 MED ORDER — DEXAMETHASONE SODIUM PHOSPHATE 10 MG/ML IJ SOLN
INTRAMUSCULAR | Status: AC
  Filled 2020-10-24: qty 1

## 2020-10-24 MED ORDER — ONDANSETRON HCL 4 MG/2ML IJ SOLN
INTRAMUSCULAR | Status: AC
  Filled 2020-10-24: qty 2

## 2020-10-24 MED ORDER — LIDOCAINE 2% (20 MG/ML) 5 ML SYRINGE
INTRAMUSCULAR | Status: AC
  Filled 2020-10-24: qty 5

## 2020-10-24 MED ORDER — MEPERIDINE HCL 25 MG/ML IJ SOLN: 6.2500 mg | INTRAMUSCULAR | Status: DC | PRN

## 2020-10-24 MED ORDER — FENTANYL CITRATE (PF) 100 MCG/2ML IJ SOLN
INTRAMUSCULAR | Status: AC
  Filled 2020-10-24: qty 2

## 2020-10-24 MED ORDER — BUPIVACAINE HCL (PF) 0.25 % IJ SOLN
INTRAMUSCULAR | Status: DC | PRN
  Administered 2020-10-24: 20 mL

## 2020-10-24 MED ORDER — HYDROMORPHONE HCL 1 MG/ML IJ SOLN: 0.2500 mg | INTRAMUSCULAR | Status: DC | PRN

## 2020-10-24 MED ORDER — POVIDONE-IODINE 10 % EX SWAB
2.0000 "application " | Freq: Once | CUTANEOUS | Status: AC
  Administered 2020-10-24: 2 via TOPICAL

## 2020-10-24 MED ORDER — FENTANYL CITRATE (PF) 100 MCG/2ML IJ SOLN
INTRAMUSCULAR | Status: DC | PRN
  Administered 2020-10-24: 100 ug via INTRAVENOUS

## 2020-10-24 MED ORDER — OXYCODONE HCL 5 MG PO TABS
5.0000 mg | ORAL_TABLET | Freq: Once | ORAL | Status: DC | PRN
Start: 2020-10-24 — End: 2020-10-24

## 2020-10-24 MED ORDER — ONDANSETRON HCL 4 MG/2ML IJ SOLN
INTRAMUSCULAR | Status: DC | PRN
  Administered 2020-10-24: 4 mg via INTRAVENOUS

## 2020-10-24 MED ORDER — SODIUM CHLORIDE 0.9 % IR SOLN
Status: DC | PRN
  Administered 2020-10-24: 1221 mL

## 2020-10-24 MED ORDER — ACETAMINOPHEN 500 MG PO TABS
ORAL_TABLET | ORAL | Status: AC
  Filled 2020-10-24: qty 2

## 2020-10-24 MED ORDER — MIDAZOLAM HCL 2 MG/2ML IJ SOLN
INTRAMUSCULAR | Status: AC
  Filled 2020-10-24: qty 2

## 2020-10-24 MED ORDER — OXYCODONE HCL 5 MG/5ML PO SOLN: 5.0000 mg | Freq: Once | ORAL | Status: DC | PRN

## 2020-10-24 MED ORDER — MIDAZOLAM HCL 5 MG/5ML IJ SOLN
INTRAMUSCULAR | Status: DC | PRN
  Administered 2020-10-24: 2 mg via INTRAVENOUS

## 2020-10-24 MED ORDER — PROMETHAZINE HCL 25 MG/ML IJ SOLN: 6.2500 mg | INTRAMUSCULAR | Status: DC | PRN

## 2020-10-24 MED ORDER — OXYCODONE HCL 5 MG PO TABS: 5.0000 mg | ORAL_TABLET | Freq: Four times a day (QID) | ORAL | 0 refills | Status: AC | PRN

## 2020-10-24 MED ORDER — DEXAMETHASONE SODIUM PHOSPHATE 4 MG/ML IJ SOLN
INTRAMUSCULAR | Status: DC | PRN
  Administered 2020-10-24: 10 mg via INTRAVENOUS

## 2020-10-24 MED ORDER — AMISULPRIDE (ANTIEMETIC) 5 MG/2ML IV SOLN: 10.0000 mg | Freq: Once | INTRAVENOUS | Status: DC | PRN

## 2020-10-24 MED ORDER — PHENYLEPHRINE 40 MCG/ML (10ML) SYRINGE FOR IV PUSH (FOR BLOOD PRESSURE SUPPORT)
PREFILLED_SYRINGE | INTRAVENOUS | Status: AC
  Filled 2020-10-24: qty 10

## 2020-10-24 MED ORDER — ACETAMINOPHEN 500 MG PO TABS
1000.0000 mg | ORAL_TABLET | ORAL | Status: AC
  Administered 2020-10-24: 1000 mg via ORAL

## 2020-10-24 SURGICAL SUPPLY — 16 items
CATH ROBINSON RED A/P 16FR (CATHETERS) IMPLANT
DEVICE MYOSURE LITE (MISCELLANEOUS) IMPLANT
DEVICE MYOSURE REACH (MISCELLANEOUS) ×2 IMPLANT
DILATOR CANAL MILEX (MISCELLANEOUS) ×2 IMPLANT
GAUZE 4X4 16PLY RFD (DISPOSABLE) ×3 IMPLANT
GLOVE BIOGEL M 6.5 STRL (GLOVE) ×3 IMPLANT
GLOVE SURG UNDER POLY LF SZ6.5 (GLOVE) ×3 IMPLANT
GLOVE SURG UNDER POLY LF SZ7 (GLOVE) ×3 IMPLANT
GOWN STRL REUS W/TWL LRG LVL3 (GOWN DISPOSABLE) ×6 IMPLANT
KIT PROCEDURE FLUENT (KITS) ×3 IMPLANT
PACK VAGINAL MINOR WOMEN LF (CUSTOM PROCEDURE TRAY) ×3 IMPLANT
PAD OB MATERNITY 4.3X12.25 (PERSONAL CARE ITEMS) ×3 IMPLANT
PAD PREP 24X48 CUFFED NSTRL (MISCELLANEOUS) ×3 IMPLANT
SEAL ROD LENS SCOPE MYOSURE (ABLATOR) ×3 IMPLANT
SLEEVE SCD COMPRESS KNEE MED (MISCELLANEOUS) ×3 IMPLANT
TOWEL GREEN STERILE FF (TOWEL DISPOSABLE) ×3 IMPLANT

## 2020-10-24 NOTE — Anesthesia Preprocedure Evaluation (Signed)
Anesthesia Evaluation  Patient identified by MRN, date of birth, ID band Patient awake    Reviewed: Allergy & Precautions, H&P , NPO status , Patient's Chart, lab work & pertinent test results  Airway Mallampati: II  TM Distance: >3 FB Neck ROM: Full    Dental no notable dental hx.    Pulmonary neg pulmonary ROS,    Pulmonary exam normal breath sounds clear to auscultation       Cardiovascular negative cardio ROS Normal cardiovascular exam Rhythm:Regular Rate:Normal     Neuro/Psych negative neurological ROS  negative psych ROS   GI/Hepatic Neg liver ROS, GERD  ,  Endo/Other  Hypothyroidism   Renal/GU negative Renal ROS  negative genitourinary   Musculoskeletal negative musculoskeletal ROS (+)   Abdominal (+) + obese,   Peds negative pediatric ROS (+)  Hematology negative hematology ROS (+)   Anesthesia Other Findings   Reproductive/Obstetrics negative OB ROS                             Anesthesia Physical Anesthesia Plan  ASA: II  Anesthesia Plan: General   Post-op Pain Management:    Induction: Intravenous  PONV Risk Score and Plan: 3 and Ondansetron, Dexamethasone, Midazolam and Treatment may vary due to age or medical condition  Airway Management Planned: LMA  Additional Equipment:   Intra-op Plan:   Post-operative Plan: Extubation in OR  Informed Consent: I have reviewed the patients History and Physical, chart, labs and discussed the procedure including the risks, benefits and alternatives for the proposed anesthesia with the patient or authorized representative who has indicated his/her understanding and acceptance.     Dental advisory given  Plan Discussed with: CRNA  Anesthesia Plan Comments:         Anesthesia Quick Evaluation

## 2020-10-24 NOTE — Anesthesia Procedure Notes (Signed)
Procedure Name: LMA Insertion Date/Time: 10/24/2020 12:54 PM Performed by: Lieutenant Diego, CRNA Pre-anesthesia Checklist: Patient identified, Emergency Drugs available, Suction available and Patient being monitored Patient Re-evaluated:Patient Re-evaluated prior to induction Oxygen Delivery Method: Circle system utilized Preoxygenation: Pre-oxygenation with 100% oxygen Induction Type: IV induction Ventilation: Mask ventilation without difficulty LMA: LMA inserted LMA Size: 4.0 Number of attempts: 1 Placement Confirmation: positive ETCO2 and breath sounds checked- equal and bilateral Tube secured with: Tape Dental Injury: Teeth and Oropharynx as per pre-operative assessment

## 2020-10-24 NOTE — Transfer of Care (Signed)
Immediate Anesthesia Transfer of Care Note  Patient: Jill Berry  Procedure(s) Performed: DILATATION & CURETTAGE/HYSTEROSCOPY WITH MYOSURE POLYPECTOMY. (N/A Vagina )  Patient Location: PACU  Anesthesia Type:General  Level of Consciousness: awake and alert   Airway & Oxygen Therapy: Patient Spontanous Breathing and Patient connected to face mask oxygen  Post-op Assessment: Report given to RN and Post -op Vital signs reviewed and stable  Post vital signs: Reviewed and stable  Last Vitals:  Vitals Value Taken Time  BP    Temp    Pulse 90 10/24/20 1331  Resp 9 10/24/20 1331  SpO2 100 % 10/24/20 1331  Vitals shown include unvalidated device data.  Last Pain:  Vitals:   10/24/20 1024  TempSrc: Oral  PainSc: 0-No pain         Complications: No complications documented.

## 2020-10-24 NOTE — H&P (Deleted)
  The note originally documented on this encounter has been moved the the encounter in which it belongs.  

## 2020-10-24 NOTE — H&P (Signed)
Reason for Appointment   1. 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 Has patient received COVID-19 booster? Yes, [employer]. Does patient report new onset of COVID symptoms? No. Has patient or close contact tested positive for COVID-19? No , not in the past 2 weeks.  General:          60 yo presents for pre-op visit.        Pt is scheduled for hysteroscopy/D&C w/ MyoSure polypectomy on Oct 24, 2020 secondary to PMB and endometrial masses.        Pt last seen Dec 29, 2019 c/o decreased sexual activity due to dyspareunia. Pelvic exam revealed hyperpigmented lesion on RT vaginal sidewall approx. 6 cm into vaginal vault and atrophic cervix and vagina. Premarin prescribed.        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        Pt contacted office on Jul 24, 2020 c/o spotting noticed from vagina after a BM. Endorsed constipation and straining. Denied rectal bleeding. 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 Colonoscopy in [place], [place] on Jan 18, 2013 was normal.        Pt seen Aug 24, 2020 c/o sporadic spotting when using the bathroom, onset Oct 2021. She had 5 occurrences in Nov. Endorsed constipation, constant straining, and hard stool w/ daily BM, managed by MiraLax. Pt not currently sexually active and stopped using Premarin about 2 months ago. Pelvic exam revealed small amt of granulation tissue at the top of the vaginal wall lateral to the cervix approx. 5 mm in size. Friable and bleeding w/ palpation of scope head. Silver nitrate applied. Vagina slightly atrophic.        Pt last seen Sep 18, 2020 and reported bleeding had slowed down since Dec 2021. Pelvic exam revealed narrow vaginal vault. Cervix slightly stenotic. Pt confirmed interest in hysteroscopy.        U/S on September 18, 2020 revealed uterus measuring 7.4 x 3.2 x 4.1  cm. No UT anomalies seen. Endometrium measured 7.2 mm. Endometrium fluid filled w/ 2 hyperechoic masses: one fundal measuring 0.8 cm and the other anterior measuring 0.5 cm. Both avascular. RT OV not visualized due to bowel gases. LT OV WNL.        Pelvic exam normal.      Current Medications  Taking  .Protonix(Pantoprazole Sodium) 40 MG Tablet Delayed Release 1 tablet Orally Once a day    .Allegra Allergy(Fexofenadine HCl) 180 MG Tablet 1 tablet Orally Once a day, Notes: prn    .Rosuvastatin Calcium 10 MG Tablet TAKE 1 TABLET BY MOUTH EVERY DAY     .Levothyroxine Sodium 50 MCG Tablet TAKE 1 TABLET BY MOUTH EVERY DAY ON EMPTY STOMACH IN THE MORNING     .Multivitamin . Tablet 1 tablet by mouth Once daily, Notes: OTC    .Pepcid(Famotidine) 20 MG Tablet 1 tablet at bedtime as needed Orally Once a day, Notes: OTC    .Calcium + D(Calcium-Vitamin D) 315-200 MG-UNIT Tablet 1 tablet Orally Twice a day, Notes: OTC    .Immune System Booster , Notes: OTC   Not-Taking  .Premarin(Estrogens Conjugated) 0.625 MG/GM Cream as directed Vaginal Once a day for 14 days then 2 times a week    Medication List reviewed and reconciled with the patient  Past Medical History        Hypothyroidism.         Hypercholesterolemia.         Jerrye Bushy.         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         RSV 08/2018.         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 1 son(s) , 1 daughter(s) .        Social History  General:   Tobacco use  cigarettes: Never smoked, Tobacco history last updated 10/11/2020, Vaping No. no EXPOSURE TO PASSIVE SMOKE. Alcohol: yes, occasional glass of wine. Caffeine: yes, Coffee daily, and occasionally Mountain Dew and tea. DIET: lacking. Exercise: yes, walks some. DENTAL CARE: good. Marital Status: married. Children: Boys, 1, girls, 1. EDUCATION: Some College. 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 COMMUNICATION BARRIERS: none.      Gyn History  Sexual activity currently sexually active.   Denies H/O Periods :.   LMP 2017 OR 2018 .   Denies H/O Birth control.   Last pap smear date 12/29/2019-ASCUS/high risk HPV neg.   Last mammogram date 11/18/2018-normal.   H/O Abnormal pap smear ASCUS.   Denies H/O STD.       OB History  Number of pregnancies 2.   Pregnancy # 1 live birth, boy, C-section delivery.   Pregnancy # 2 live birth, girl, C-section.       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 Fatigue No. Fever No. Night sweats No. Recent travel outside [place] No. Sweats No. Weight change No.     OPHTHALMOLOGY:         Blurring of vision no. Change in vision no. Double vision no.     ENT:         Dizziness no. Nose bleeds no. Sore throat no. Teeth pain no.     ALLERGY:         Hives no.     CARDIOLOGY:         Chest pain no. High blood pressure no. Irregular heart beat no. Leg edema no. Palpitations no.     RESPIRATORY:         Shortness of breath no. Cough no. Wheezing no.     UROLOGY:         Pain with urination no. Urinary urgency no. Urinary frequency no. Urinary  incontinence no. Difficulty urinating No. Blood  in urine No.     GASTROENTEROLOGY:         Abdominal pain no. Appetite change no. Bloating/belching no. Blood in stool or on toilet paper no. Change in bowel movements no. Constipation no. Diarrhea no. Difficulty swallowing no. Nausea no.     FEMALE REPRODUCTIVE:         Vulvar pain no. Vulvar rash no. Abnormal vaginal bleeding no. Breast pain no. Nipple discharge no. Pain with intercourse no. Pelvic pain no. Unusual vaginal discharge no. Vaginal itching no.     MUSCULOSKELETAL:         Muscle aches no.     NEUROLOGY:         Headache no. Tingling/numbness no. Weakness no.     PSYCHOLOGY:         Depression no. Anxiety no. Nervousness no. Sleep disturbances no. Suicidal ideation no .     ENDOCRINOLOGY:         Excessive thirst no. Excessive urination no. Hair loss no. Heat or cold intolerance no.     HEMATOLOGY/LYMPH:         Abnormal bleeding no. Easy bruising no. Swollen glands no.     DERMATOLOGY:         New/changing skin lesion no. Rash no. Sores no.     Negative except as stated in HPI.      Vital Signs  Wt 191.6, Wt change .6 lb, Ht 62.75, BMI 34.21, Pulse sitting 81, BP sitting 130/86.     Examination  General Examination:       CONSTITUTIONAL: alert, oriented, NAD . SKIN: moist, warm. EYES: Conjunctiva clear. LUNGS: good I:E efffort noted, CTA bilat. HEART: RRR. ABDOMEN: soft, non-tender/non-distended, bowel sounds present . FEMALE GENITOURINARY: normal external genitalia, labia - unremarkable, vagina - pink moist mucosa, no lesions or abnormal discharge, cervix - no discharge or lesions or CMT, adnexa - no masses or tenderness, uterus - nontender and normal size on palpation . PSYCH: affect normal, good eye contact.                Assessments     1. Postmenopausal bleeding - N95.0 (Primary)     Treatment   1. Postmenopausal bleeding   Notes: Pt is scheduled for hysteroscopy/D&C w/ MyoSure polypectomy on Oct 24, 2020 secondary to PMB and endometrial mass. Pt is  advised she will be able to return home the same day. Discussed risks of hysteroscopy including but not limited to infection, bleeding, possible perforation of the uterus, with the need for further surgery. Pt advised to avoid NSAIDs (Aspirin, Aleve, Advil, Ibuprofen, Motrin) from now until surgery given risk of bleeding during surgery. She may take Tylenol for pain management. She is advised to avoid eating or drinking starting midnight prior to surgery. Pt is advised that she may have watery discharge, spotting/bleeding, or cramping after surgery. Discussed post-surgery avoidance of driving for 24 hours and avoidance of lifting weight greater than 10 lbs or intercourse for 2 weeks after procedure. Provided pt w/ 2 Cytotec tablets to use vaginally the night prior to her surgery and morning of her surgery. Follow up in 4 weeks for 2 wk post-op visit.

## 2020-10-24 NOTE — Op Note (Signed)
10/24/2020  1:28 PM  PATIENT:  Jill Berry  60 y.o. female  PRE-OPERATIVE DIAGNOSIS:  Postmenopausal Bleeding  POST-OPERATIVE DIAGNOSIS:  Postmenopausal Bleeding  PROCEDURE:  Procedure(s): DILATATION & CURETTAGE/HYSTEROSCOPY WITH MYOSURE POLYPECTOMY. (N/A)  SURGEON:  Surgeon(s) and Role:    Christophe Louis, MD - Primary  PHYSICIAN ASSISTANT: none  ASSISTANTS: none   ANESTHESIA:   general  EBL:  5 mL   BLOOD ADMINISTERED:none  DRAINS: none   LOCAL MEDICATIONS USED:  MARCAINE     SPECIMEN:  Source of Specimen:  endometrial curettings and polyps   DISPOSITION OF SPECIMEN:  PATHOLOGY  COUNTS:  YES  TOURNIQUET:  * No tourniquets in log *  DICTATION: .Dragon Dictation  PLAN OF CARE: Discharge to home after PACU  PATIENT DISPOSITION:  PACU - hemodynamically stable.   Delay start of Pharmacological VTE agent (>24hrs) due to surgical blood loss or risk of bleeding: not applicable  Findings: narrow vaginal vault. Atrophic appearing cervix. 4 endometrial polyps noted  Surrounding endometrium appeared atrophic   Procedure: Patient was taken to the operating room where she was placed under general anesthesia. She was placed in the dorsal lithotomy position. She was prepped and draped in the usual sterile fashion. A speculum was placed into the vaginal vault. The anterior lip of the cervix was grasped with a single-tooth tenaculum. Quarter percent Marcaine was injected at the 4 and 8:00 positions of the cervix. The cervix was then sounded to 6 cm. The cervix was dilated to approximately 6 mm. Mysosure operative  hysteroscope was inserted. The findings noted above. Myosure reach  blade was introduced throught the hysteroscope. The endometrial mass was removed in less than  5 minute.  There was no evidence of perforation.The hysteroscope was removed.  The single-tooth tenaculum was removed from the anterior lip of the cervix. excellent hemostasis was noted. The speculum was removed from  the patient's vagina. She was awakened from anesthesia taken care  To the recovery  room awake and in stable condition. Sponge lap and needle counts were correct x2.

## 2020-10-24 NOTE — H&P (Signed)
Date of Initial H&P:10/24/2020 History reviewed, patient examined, no change in status, stable for surgery.

## 2020-10-24 NOTE — Discharge Instructions (Signed)
°  Post Anesthesia Home Care Instructions  Activity: Get plenty of rest for the remainder of the day. A responsible individual must stay with you for 24 hours following the procedure.  For the next 24 hours, DO NOT: -Drive a car -Paediatric nurse -Drink alcoholic beverages -Take any medication unless instructed by your physician -Make any legal decisions or sign important papers.  Meals: Start with liquid foods such as gelatin or soup. Progress to regular foods as tolerated. Avoid greasy, spicy, heavy foods. If nausea and/or vomiting occur, drink only clear liquids until the nausea and/or vomiting subsides. Call your physician if vomiting continues.  Special Instructions/Symptoms: Your throat may feel dry or sore from the anesthesia or the breathing tube placed in your throat during surgery. If this causes discomfort, gargle with warm salt water. The discomfort should disappear within 24 hours.  If you had a scopolamine patch placed behind your ear for the management of post- operative nausea and/or vomiting:  1. The medication in the patch is effective for 72 hours, after which it should be removed.  Wrap patch in a tissue and discard in the trash. Wash hands thoroughly with soap and water. 2. You may remove the patch earlier than 72 hours if you experience unpleasant side effects which may include dry mouth, dizziness or visual disturbances. 3. Avoid touching the patch. Wash your hands with soap and water after contact with the patch.    No Tylenol until 4:30PM.

## 2020-10-24 NOTE — Anesthesia Postprocedure Evaluation (Signed)
Anesthesia Post Note  Patient: JONESSA TRIPLETT  Procedure(s) Performed: DILATATION & CURETTAGE/HYSTEROSCOPY WITH MYOSURE POLYPECTOMY. (N/A Vagina )     Patient location during evaluation: PACU Anesthesia Type: General Level of consciousness: awake and alert Pain management: pain level controlled Vital Signs Assessment: post-procedure vital signs reviewed and stable Respiratory status: spontaneous breathing, nonlabored ventilation and respiratory function stable Cardiovascular status: blood pressure returned to baseline and stable Postop Assessment: no apparent nausea or vomiting Anesthetic complications: no   No complications documented.  Last Vitals:  Vitals:   10/24/20 1400 10/24/20 1432  BP: (!) 118/56 117/66  Pulse: 83 80  Resp: 14 16  Temp:  36.6 C  SpO2: 100% 100%    Last Pain:  Vitals:   10/24/20 1423  TempSrc:   PainSc: 0-No pain                 Lynda Rainwater

## 2020-10-26 ENCOUNTER — Encounter (HOSPITAL_BASED_OUTPATIENT_CLINIC_OR_DEPARTMENT_OTHER): Payer: Self-pay | Admitting: Obstetrics and Gynecology

## 2020-10-26 LAB — SURGICAL PATHOLOGY

## 2021-11-13 ENCOUNTER — Other Ambulatory Visit: Payer: Self-pay | Admitting: Family Medicine

## 2021-11-13 DIAGNOSIS — Z1231 Encounter for screening mammogram for malignant neoplasm of breast: Secondary | ICD-10-CM

## 2021-11-22 ENCOUNTER — Ambulatory Visit
Admission: RE | Admit: 2021-11-22 | Discharge: 2021-11-22 | Disposition: A | Discharge status: Home / Self Care | Payer: BC Managed Care – PPO | Source: Ambulatory Visit | Attending: Family Medicine | Admitting: Family Medicine

## 2021-11-22 DIAGNOSIS — Z1231 Encounter for screening mammogram for malignant neoplasm of breast: Secondary | ICD-10-CM

## 2022-10-22 ENCOUNTER — Other Ambulatory Visit: Payer: Self-pay | Admitting: Family Medicine

## 2022-10-22 DIAGNOSIS — Z1231 Encounter for screening mammogram for malignant neoplasm of breast: Secondary | ICD-10-CM

## 2022-11-28 ENCOUNTER — Other Ambulatory Visit: Payer: Self-pay | Admitting: Family Medicine

## 2022-11-28 DIAGNOSIS — Z780 Asymptomatic menopausal state: Secondary | ICD-10-CM

## 2022-12-05 ENCOUNTER — Ambulatory Visit
Admission: RE | Admit: 2022-12-05 | Discharge: 2022-12-05 | Disposition: A | Discharge status: Home / Self Care | Payer: BC Managed Care – PPO | Source: Ambulatory Visit | Attending: Family Medicine | Admitting: Family Medicine

## 2022-12-05 DIAGNOSIS — Z1231 Encounter for screening mammogram for malignant neoplasm of breast: Secondary | ICD-10-CM

## 2023-03-02 ENCOUNTER — Other Ambulatory Visit: Payer: Self-pay | Admitting: Family Medicine

## 2023-03-02 DIAGNOSIS — Z136 Encounter for screening for cardiovascular disorders: Secondary | ICD-10-CM

## 2023-03-20 ENCOUNTER — Ambulatory Visit
Admission: RE | Admit: 2023-03-20 | Discharge: 2023-03-20 | Disposition: A | Discharge status: Home / Self Care | Payer: No Typology Code available for payment source | Source: Ambulatory Visit | Attending: Family Medicine | Admitting: Family Medicine

## 2023-03-20 DIAGNOSIS — Z136 Encounter for screening for cardiovascular disorders: Secondary | ICD-10-CM

## 2023-06-05 ENCOUNTER — Ambulatory Visit
Admission: RE | Admit: 2023-06-05 | Discharge: 2023-06-05 | Disposition: A | Discharge status: Home / Self Care | Payer: BC Managed Care – PPO | Source: Ambulatory Visit | Attending: Family Medicine | Admitting: Family Medicine

## 2023-06-05 DIAGNOSIS — Z780 Asymptomatic menopausal state: Secondary | ICD-10-CM

## 2024-02-26 ENCOUNTER — Other Ambulatory Visit: Payer: Self-pay | Admitting: Family Medicine

## 2024-02-26 DIAGNOSIS — Z1231 Encounter for screening mammogram for malignant neoplasm of breast: Secondary | ICD-10-CM

## 2024-03-18 ENCOUNTER — Ambulatory Visit
Admission: RE | Admit: 2024-03-18 | Discharge: 2024-03-18 | Disposition: A | Discharge status: Home / Self Care | Source: Ambulatory Visit | Attending: Family Medicine | Admitting: Family Medicine

## 2024-03-18 DIAGNOSIS — Z1231 Encounter for screening mammogram for malignant neoplasm of breast: Secondary | ICD-10-CM
